# Patient Record
Sex: Male | Born: 1975 | Race: Black or African American | Hispanic: No | Marital: Married
Health system: Southern US, Community
[De-identification: ages and names within clinical notes are randomized; demographics above are authoritative.]

## PROBLEM LIST (undated history)

## (undated) DIAGNOSIS — K219 Gastro-esophageal reflux disease without esophagitis: Secondary | ICD-10-CM

## (undated) DIAGNOSIS — E119 Type 2 diabetes mellitus without complications: Secondary | ICD-10-CM

## (undated) DIAGNOSIS — N189 Chronic kidney disease, unspecified: Secondary | ICD-10-CM

## (undated) DIAGNOSIS — Z8659 Personal history of other mental and behavioral disorders: Secondary | ICD-10-CM

## (undated) DIAGNOSIS — Z8619 Personal history of other infectious and parasitic diseases: Secondary | ICD-10-CM

## (undated) DIAGNOSIS — E785 Hyperlipidemia, unspecified: Secondary | ICD-10-CM

## (undated) HISTORY — DX: Hyperlipidemia, unspecified: E78.5

## (undated) HISTORY — DX: Personal history of other infectious and parasitic diseases: Z86.19

## (undated) HISTORY — DX: Gastro-esophageal reflux disease without esophagitis: K21.9

## (undated) HISTORY — DX: Chronic kidney disease, unspecified: N18.9

## (undated) HISTORY — DX: Type 2 diabetes mellitus without complications: E11.9

## (undated) HISTORY — DX: Personal history of other mental and behavioral disorders: Z86.59

---

## 2012-06-02 ENCOUNTER — Emergency Department (HOSPITAL_COMMUNITY)
Admission: EM | Admit: 2012-06-02 | Discharge: 2012-06-02 | Disposition: A | Discharge status: Home / Self Care | Payer: Self-pay | Source: Home / Self Care

## 2012-06-02 ENCOUNTER — Encounter (HOSPITAL_COMMUNITY): Payer: Self-pay

## 2012-06-02 DIAGNOSIS — E119 Type 2 diabetes mellitus without complications: Secondary | ICD-10-CM

## 2012-06-02 LAB — CBC
MCH: 30.2 pg (ref 26.0–34.0)
MCHC: 37.6 g/dL — ABNORMAL HIGH (ref 30.0–36.0)
Platelets: 194 10*3/uL (ref 150–400)
RBC: 5.8 MIL/uL (ref 4.22–5.81)
RDW: 12 % (ref 11.5–15.5)

## 2012-06-02 LAB — COMPREHENSIVE METABOLIC PANEL
ALT: 23 U/L (ref 0–53)
AST: 20 U/L (ref 0–37)
Calcium: 9.9 mg/dL (ref 8.4–10.5)
Creatinine, Ser: 0.94 mg/dL (ref 0.50–1.35)
Sodium: 134 mEq/L — ABNORMAL LOW (ref 135–145)
Total Protein: 8.2 g/dL (ref 6.0–8.3)

## 2012-06-02 MED ORDER — FREESTYLE SYSTEM KIT: 1.0000 | PACK | Freq: Three times a day (TID) | Status: DC

## 2012-06-02 MED ORDER — METFORMIN HCL 850 MG PO TABS: 850.0000 mg | ORAL_TABLET | Freq: Two times a day (BID) | ORAL | Status: DC

## 2012-06-02 NOTE — ED Notes (Signed)
Patient Demographics  Rodney Pennington, is a 37 y.o. male  RUE:[id number]  JYN:[id number]  DOB - 1975-04-02  Chief Complaint  Patient presents with  . Excessive Sweating        Subjective:   Rodney Pennington history of type 2 diabetes mellitus is here to establish care, he also says that he has noticed some heat intolerance and sweating for the last 7-8 years, also says that he is been trying to have a child his wife but is unable to do so for the last 2-3 years. Denies any erectile problems, denies any fever chills, no chest pain cough phlegm palpitations, no history of STDs. No previous sexual contact except with his wife.  Objective:   History reviewed. No pertinent past medical history.    History reviewed. No pertinent past surgical history.   Filed Vitals:   06/02/12 1737  BP: 142/92  Pulse: 118 repeat bedside is 79   Temp: 98.6 F (37 C)  TempSrc: Oral  Resp: 17  SpO2: 99%     Exam  Awake Alert, Oriented X 3, No new F.N deficits, Normal affect Blairs.AT,PERRAL Supple Neck,No JVD, No cervical lymphadenopathy appriciated.  Symmetrical Chest wall movement, Good air movement bilaterally, CTAB RRR,No Gallops,Rubs or new Murmurs, No Parasternal Heave +ve B.Sounds, Abd Soft, Non tender, No organomegaly appriciated, No rebound - guarding or rigidity. No Cyanosis, Clubbing or edema, No new Rash or bruise  Genital and testicular exam is unremarkable no masses or lumps, no signs of varicocele    Data Review   CBC No results found for this basename: WBC, HGB, HCT, PLT, MCV, MCH, MCHC, RDW, NEUTRABS, LYMPHSABS, MONOABS, EOSABS, BASOSABS, BANDABS, BANDSABD,  in the last 168 hours  Chemistries   No results found for this basename: NA, K, CL, CO2, GLUCOSE, BUN, CREATININE, GFRCGP, CALCIUM, MG, AST, ALT, ALKPHOS, BILITOT,  in the last 168 hours ------------------------------------------------------------------------------------------------------------------ No results found for  this basename: HGBA1C,  in the last 72 hours ------------------------------------------------------------------------------------------------------------------ No results found for this basename: CHOL, HDL, LDLCALC, TRIG, CHOLHDL, LDLDIRECT,  in the last 72 hours ------------------------------------------------------------------------------------------------------------------ No results found for this basename: TSH, T4TOTAL, FREET3, T3FREE, THYROIDAB,  in the last 72 hours ------------------------------------------------------------------------------------------------------------------ No results found for this basename: VITAMINB12, FOLATE, FERRITIN, TIBC, IRON, RETICCTPCT,  in the last 72 hours  Coagulation profile  No results found for this basename: INR, PROTIME,  in the last 168 hours     Prior to Admission medications   Medication Sig Start Date End Date Taking? Authorizing Provider  glucose monitoring kit (FREESTYLE) monitoring kit 1 each by Does not apply route 4 (four) times daily - after meals and at bedtime. 1 month Diabetic Testing Supplies for QAC-QHS accuchecks. Switch to any brand that is cheaper covered. 06/02/12   Leroy Sea, MD  metFORMIN (GLUCOPHAGE) 850 MG tablet Take 1 tablet (850 mg total) by mouth 2 (two) times daily with a meal. 06/02/12   Leroy Sea, MD     Assessment & Plan   Diabetes mellitus type II. Patient is off of his Glucophage, will check A1c, we'll place him on Glucophage twice a day, provided him with glucometer and advised him to check q. a.c. at bedtime, log all results and bring in next visit.  Heat intolerance and excessive sweating for the last 7-8 years. Check TSH, CBC CMP baseline. Along with B12.  Problems with conceiving a child for the last 2 years. We'll do for him to urology for a semen analysis, physical exam is unremarkable,  he has good erections.   Follow-up Information   Schedule an appointment as soon as possible for a visit  in 1 month to follow up.      Follow up with GRAPEY,DAVID S, MD In 1 month. (for semen analysis and infertility workup)    Contact information:   [address]                         [place] [address] [phone]        Leroy Sea M.D on 06/02/2012 at 5:45 PM   Leroy Sea, MD 06/02/12 [phone]

## 2012-06-02 NOTE — ED Notes (Signed)
Patient here for excissive sweating States has elevated  Blood sugar

## 2012-06-03 LAB — HEMOGLOBIN A1C
Hgb A1c MFr Bld: 10.4 % — ABNORMAL HIGH (ref <5.7)
Mean Plasma Glucose: 252 mg/dL — ABNORMAL HIGH (ref <117)

## 2012-06-03 LAB — VITAMIN B12: Vitamin B-12: 387 pg/mL (ref 211–911)

## 2012-06-07 NOTE — ED Notes (Signed)
Referral faxed to urologist for semen analysis

## 2012-08-11 ENCOUNTER — Encounter: Payer: Self-pay | Admitting: Internal Medicine

## 2012-08-11 NOTE — Progress Notes (Signed)
Quick Note:  Please hava patient come back forDM management ______ 

## 2012-08-14 ENCOUNTER — Telehealth: Payer: Self-pay | Admitting: *Deleted

## 2012-08-14 NOTE — Telephone Encounter (Signed)
08/14/12 Message left for patient to call and schedule appoinment for  Follow up with her Diabetes. P.Devoiry Corriher,RN BSN

## 2015-03-02 HISTORY — PX: ERCP: SHX60

## 2019-05-28 ENCOUNTER — Ambulatory Visit: Payer: Self-pay | Attending: Internal Medicine

## 2019-05-28 DIAGNOSIS — Z23 Encounter for immunization: Secondary | ICD-10-CM

## 2019-05-28 NOTE — Progress Notes (Signed)
   Covid-19 Vaccination Clinic  Name:  Rodney Pennington    MRN: [id number] DOB: [birth year]  05/28/2019  Rodney Pennington was observed post Covid-19 immunization for 15 minutes without incident. He was provided with Vaccine Information Sheet and instruction to access the V-Safe system.   Rodney Pennington was instructed to call 911 with any severe reactions post vaccine: Marland Kitchen Difficulty breathing  . Swelling of face and throat  . A fast heartbeat  . A bad rash all over body  . Dizziness and weakness   Immunizations Administered    Name Date Dose VIS Date Route   JANSSEN COVID-19 VACCINE 05/28/2019  2:38 PM 0.5 mL 04/28/2019 Intramuscular   Manufacturer: [employer]   Lot: [id number]   NDC: [phone]

## 2019-06-28 ENCOUNTER — Other Ambulatory Visit: Payer: Self-pay

## 2019-06-29 ENCOUNTER — Ambulatory Visit (INDEPENDENT_AMBULATORY_CARE_PROVIDER_SITE_OTHER): Payer: 59 | Admitting: Family Medicine

## 2019-06-29 ENCOUNTER — Encounter: Payer: Self-pay | Admitting: Family Medicine

## 2019-06-29 DIAGNOSIS — E785 Hyperlipidemia, unspecified: Secondary | ICD-10-CM

## 2019-06-29 DIAGNOSIS — K219 Gastro-esophageal reflux disease without esophagitis: Secondary | ICD-10-CM

## 2019-06-29 DIAGNOSIS — E1169 Type 2 diabetes mellitus with other specified complication: Secondary | ICD-10-CM | POA: Diagnosis not present

## 2019-06-29 DIAGNOSIS — I10 Essential (primary) hypertension: Secondary | ICD-10-CM | POA: Insufficient documentation

## 2019-06-29 MED ORDER — ESOMEPRAZOLE MAGNESIUM 40 MG PO CPDR: 40.0000 mg | DELAYED_RELEASE_CAPSULE | Freq: Every day | ORAL | 3 refills | Status: DC

## 2019-06-29 MED ORDER — SITAGLIPTIN PHOSPHATE 100 MG PO TABS: 100.0000 mg | ORAL_TABLET | Freq: Every day | ORAL | 1 refills | Status: DC

## 2019-06-29 MED ORDER — TRULICITY 1.5 MG/0.5ML ~~LOC~~ SOAJ: 1.5000 mg | SUBCUTANEOUS | 0 refills | Status: DC

## 2019-06-29 NOTE — Patient Instructions (Addendum)
A few things to remember from today's visit:   Type 2 diabetes mellitus with other specified complication, without long-term current use of insulin (HCC) - Plan: Comprehensive metabolic panel, Hemoglobin A1c, Microalbumin / creatinine urine ratio, Fructosamine, Dulaglutide (TRULICITY) 1.5 MG/0.5ML SOPN  Hyperlipidemia associated with type 2 diabetes mellitus (HCC) - Plan: Comprehensive metabolic panel, Lipid panel  Hypertension, essential, benign - Plan: Comprehensive metabolic panel  Gastroesophageal reflux disease without esophagitis - Plan: esomeprazole (NEXIUM) 40 MG capsule Today while discontinuing Januvia and glipizide. Trulicity samples given to take once per week, if well-tolerated you can pick up prescription for 1.5 mg to continue once weekly. Metformin 500 mg with breakfast and supper. Further recommendation will be given according to A1c results, we may need to have insulin depending of numbers.  You should be on a cholesterol medication for cardiovascular protection.   Please be sure medication list is accurate. If a new problem present, please set up appointment sooner than planned today.

## 2019-06-29 NOTE — Progress Notes (Signed)
HPI:  Rodney Pennington is a 44 y.o. male, who is here today to establish care.  Former PCP: N/A Last preventive routine visit: Within the past year in [place].  Chronic medical problems: DM 2, CKD, hyperlipidemia,nephrolithiasis, "sweating disorder", chronic shoulder pain,and GERD among some. Cholelithiasis complicated by pancreatitis. He did not have cholecystectomy, s/p ERCP in 2017.  DM II: Dx'ed in 2013. He is on Glipizide 120 mg daily,Metformin 1500 mg daily,and Januvia 100 mg daily. He was on Metformin 850 mg, did not think is was being absorbed because the whole tab was in his stool. He thinks Metformin 500 mg works better.  He would like to establish with endocrinologist.  Metformin causes loose stools sometimes, proceeded by mild lower abdominal cramps. Last eye exam 5 years ago. Negative for feet numbness,tingling,or burning.  In 05/2012 HgA1C 10.4.  Negative for polydipsia,polyuria, or polyphagia.  HLD: He is on Non pharmacologic treatment.  "GI problems."  Having heartburn,burping, and "indigestion." Exacerbated by certain foods and by smoke when somebody smoking closed to him. No alleviating factors identified.  He has tried Omeprazole but with not significant improvement. He has not noted changes in bowel habits,N/V,melena,blood in stool,or abnormal wt loss.  Review of Systems  Constitutional: Negative for activity change, appetite change, fatigue and fever.  HENT: Negative for mouth sores, nosebleeds, sore throat and trouble swallowing.   Eyes: Negative for redness and visual disturbance.  Respiratory: Negative for cough, shortness of breath and wheezing.   Cardiovascular: Negative for chest pain, palpitations and leg swelling.  Genitourinary: Negative for decreased urine volume, dysuria and hematuria.  Musculoskeletal: Positive for arthralgias. Negative for gait problem and myalgias.  Skin: Negative for rash and wound.  Neurological:  Negative for dizziness, seizures, weakness, numbness and headaches.  Psychiatric/Behavioral: Negative for confusion.  Rest see pertinent positives and negatives per HPI.  Current Outpatient Medications on File Prior to Visit  Medication Sig Dispense Refill  . glucose monitoring kit (FREESTYLE) monitoring kit 1 each by Does not apply route 4 (four) times daily - after meals and at bedtime. 1 month Diabetic Testing Supplies for QAC-QHS accuchecks. Switch to any brand that is cheaper covered. 1 each 1  . metFORMIN (GLUCOPHAGE) 500 MG tablet Take 500 mg by mouth 2 (two) times daily with a meal. 1000 mg after lunch and 500 mg after dinner daily    . Multiple Vitamin (MULTIVITAMIN) tablet Take 1 tablet by mouth daily.     No current facility-administered medications on file prior to visit.   Past Medical History:  Diagnosis Date  . Chronic kidney disease   . Diabetes mellitus without complication ([place])   . GERD (gastroesophageal reflux disease)   . History of chicken pox   . Hx of eating disorder   . Hyperlipidemia    No Known Allergies  Family History  Problem Relation Age of Onset  . Arthritis Mother   . Heart attack Mother   . Hyperlipidemia Mother   . Kidney disease Mother   . Arthritis Father   . Arthritis Sister   . Hypertension Paternal Uncle   . Hearing loss Sister   . Hypertension Sister   . Kidney disease Sister   . Diabetes Brother     Social History   Socioeconomic History  . Marital status: Married    Spouse name: Not on file  . Number of children: Not on file  . Years of education: Not on file  . Highest education level: Not  on file  Occupational History  . Not on file  Tobacco Use  . Smoking status: Never Smoker  . Smokeless tobacco: Never Used  Substance and Sexual Activity  . Alcohol use: No  . Drug use: Never  . Sexual activity: Yes  Other Topics Concern  . Not on file  Social History Narrative  . Not on file   Social Determinants of Health    Financial Resource Strain:   . Difficulty of Paying Living Expenses:   Food Insecurity:   . Worried About Charity fundraiser in the Last Year:   . Arboriculturist in the Last Year:   Transportation Needs:   . Film/video editor (Medical):   Marland Kitchen Lack of Transportation (Non-Medical):   Physical Activity:   . Days of Exercise per Week:   . Minutes of Exercise per Session:   Stress:   . Feeling of Stress :   Social Connections:   . Frequency of Communication with Friends and Family:   . Frequency of Social Gatherings with Friends and Family:   . Attends Religious Services:   . Active Member of Clubs or Organizations:   . Attends Archivist Meetings:   Marland Kitchen Marital Status:     Vitals:   06/29/19 1410  BP: 122/80  Pulse: 83  Resp: 12  Temp: 98 F (36.7 C)  SpO2: 99%   Body mass index is 24.94 kg/m.  Physical Exam  Nursing note reviewed. Constitutional: He is oriented to person, place, and time. He appears well-developed and well-nourished. No distress.  HENT:  Head: Normocephalic and atraumatic.  Mouth/Throat: Oropharynx is clear and moist and mucous membranes are normal.  Eyes: Pupils are equal, round, and reactive to light. Conjunctivae are normal.  Cardiovascular: Normal rate and regular rhythm.  No murmur heard. Pulses:      Dorsalis pedis pulses are 2+ on the right side and 2+ on the left side.  Respiratory: Effort normal and breath sounds normal. No respiratory distress.  GI: Soft. He exhibits no mass. There is no hepatomegaly. There is no abdominal tenderness.  Musculoskeletal:        General: No edema.  Lymphadenopathy:    He has no cervical adenopathy.  Neurological: He is alert and oriented to person, place, and time. He has normal strength. No cranial nerve deficit. Gait normal.  Skin: Skin is warm. No rash noted. No erythema.  Psychiatric: He has a normal mood and affect. Cognition and memory are normal.  Well groomed, good eye contact.    Diabetic Foot Exam - Simple   Simple Foot Form Diabetic Foot exam was performed with the following findings: Yes 06/29/2019  4:08 PM  Visual Inspection No deformities, no ulcerations, no other skin breakdown bilaterally: Yes Sensation Testing Intact to touch and monofilament testing bilaterally: Yes Pulse Check Posterior Tibialis and Dorsalis pulse intact bilaterally: Yes Comments     ASSESSMENT AND PLAN:  Rodney Pennington was seen today for establish care.  Diagnoses and all orders for this visit:  Lab Results  Component Value Date   HGBA1C 10.7 (H) 06/29/2019   Lab Results  Component Value Date   CREATININE 0.76 06/29/2019   BUN 13 06/29/2019   NA 139 06/29/2019   K 4.5 06/29/2019   CL 101 06/29/2019   CO2 22 06/29/2019   Lab Results  Component Value Date   ALT 14 06/29/2019   AST 14 06/29/2019   ALKPHOS 110 06/02/2012   BILITOT 0.5 06/29/2019  Lab Results  Component Value Date   CHOL 238 (H) 06/29/2019   HDL 49 06/29/2019   LDLCALC 152 (H) 06/29/2019   TRIG 229 (H) 06/29/2019   CHOLHDL 4.9 06/29/2019   Lab Results  Component Value Date   MICROALBUR 5.4 06/29/2019    Type 2 diabetes mellitus with other specified complication, without long-term current use of insulin (HCC) Problem has not been well controlled  No changes in current management. Stop Glipizide and Januvia. Recommend trying Trulicity, 643 mg samples given. No changes in Metformin, tried to reassure in regard to metformin not being absorbed.  Regular exercise and healthy diet with avoidance of added sugar food intake is an important part of treatment and recommended. Annual eye exam, periodic dental and foot care recommended.  -     Dulaglutide (TRULICITY) 1.5 XU/2.7AR SOPN; Inject 1.5 mg into the skin once a week.  Hyperlipidemia associated with type 2 diabetes mellitus ([place]) He is not on statin med, we discussed CV benefits Further recommendations according to lipid panel results.   Gastroesophageal reflux disease without esophagitis GERD precautions. He would like to try Nexium, 40 mg recommended. Once symptoms are well controlled he can try to decrease dose to 20 mg.  -     esomeprazole (NEXIUM) 40 MG capsule; Take 1 capsule (40 mg total) by mouth daily.   Return in about 4 months (around 10/29/2019).    Blakely Gluth G. [place], MD  [facility]. [place] office.

## 2019-07-02 ENCOUNTER — Other Ambulatory Visit: Payer: Self-pay | Admitting: *Deleted

## 2019-07-02 MED ORDER — INSULIN GLARGINE 100 UNIT/ML ~~LOC~~ SOLN: 10.0000 [IU] | Freq: Every day | SUBCUTANEOUS | 11 refills | Status: DC

## 2019-07-02 MED ORDER — ATORVASTATIN CALCIUM 20 MG PO TABS: 20.0000 mg | ORAL_TABLET | Freq: Every day | ORAL | 3 refills | Status: DC

## 2019-07-04 LAB — COMPREHENSIVE METABOLIC PANEL
AG Ratio: 1.5 (calc) (ref 1.0–2.5)
ALT: 14 U/L (ref 9–46)
AST: 14 U/L (ref 10–40)
Albumin: 4.6 g/dL (ref 3.6–5.1)
Alkaline phosphatase (APISO): 102 U/L (ref 36–130)
BUN: 13 mg/dL (ref 7–25)
CO2: 22 mmol/L (ref 20–32)
Calcium: 9.7 mg/dL (ref 8.6–10.3)
Chloride: 101 mmol/L (ref 98–110)
Creat: 0.76 mg/dL (ref 0.60–1.35)
Globulin: 3 g/dL (calc) (ref 1.9–3.7)
Glucose, Bld: 231 mg/dL — ABNORMAL HIGH (ref 65–99)
Potassium: 4.5 mmol/L (ref 3.5–5.3)
Sodium: 139 mmol/L (ref 135–146)
Total Bilirubin: 0.5 mg/dL (ref 0.2–1.2)
Total Protein: 7.6 g/dL (ref 6.1–8.1)

## 2019-07-04 LAB — LIPID PANEL
Cholesterol: 238 mg/dL — ABNORMAL HIGH (ref <200)
HDL: 49 mg/dL (ref >=40)
LDL Cholesterol (Calc): 152 mg/dL (calc) — ABNORMAL HIGH
Non-HDL Cholesterol (Calc): 189 mg/dL (calc) — ABNORMAL HIGH (ref <130)
Total CHOL/HDL Ratio: 4.9 (calc) (ref <5.0)
Triglycerides: 229 mg/dL — ABNORMAL HIGH (ref <150)

## 2019-07-04 LAB — MICROALBUMIN / CREATININE URINE RATIO
Creatinine, Urine: 243 mg/dL (ref 20–320)
Microalb Creat Ratio: 22 mcg/mg creat (ref <30)
Microalb, Ur: 5.4 mg/dL

## 2019-07-04 LAB — HEMOGLOBIN A1C
Hgb A1c MFr Bld: 10.7 % of total Hgb — ABNORMAL HIGH (ref <5.7)
Mean Plasma Glucose: 260 (calc)
eAG (mmol/L): 14.4 (calc)

## 2019-07-04 LAB — FRUCTOSAMINE: Fructosamine: 429 umol/L — ABNORMAL HIGH (ref 205–285)

## 2019-07-06 ENCOUNTER — Other Ambulatory Visit: Payer: Self-pay | Admitting: Family Medicine

## 2019-07-06 ENCOUNTER — Other Ambulatory Visit: Payer: Self-pay | Admitting: *Deleted

## 2019-07-06 DIAGNOSIS — E1169 Type 2 diabetes mellitus with other specified complication: Secondary | ICD-10-CM

## 2019-07-06 MED ORDER — ACCU-CHEK SOFTCLIX LANCETS MISC: 12 refills | Status: AC

## 2019-07-06 MED ORDER — ACCU-CHEK GUIDE ME W/DEVICE KIT: PACK | 0 refills | Status: AC

## 2019-07-06 MED ORDER — INSULIN GLARGINE 100 UNITS/ML SOLOSTAR PEN: 10.0000 [IU] | PEN_INJECTOR | Freq: Every day | SUBCUTANEOUS | 3 refills | Status: DC

## 2019-07-06 MED ORDER — METFORMIN HCL 500 MG PO TABS: 500.0000 mg | ORAL_TABLET | Freq: Two times a day (BID) | ORAL | 0 refills | Status: DC

## 2019-07-06 MED ORDER — ACCU-CHEK GUIDE VI STRP: ORAL_STRIP | 12 refills | Status: AC

## 2019-07-06 MED ORDER — INSULIN GLARGINE 100 UNITS/ML SOLOSTAR PEN: 10.0000 [IU] | PEN_INJECTOR | Freq: Every day | SUBCUTANEOUS | 11 refills | Status: DC

## 2019-07-31 ENCOUNTER — Other Ambulatory Visit: Payer: Self-pay | Admitting: *Deleted

## 2019-07-31 MED ORDER — BLOOD GLUCOSE METER KIT: PACK | 0 refills | Status: DC

## 2019-07-31 MED ORDER — BLOOD GLUCOSE METER KIT: PACK | 0 refills | Status: AC

## 2019-08-03 ENCOUNTER — Ambulatory Visit (INDEPENDENT_AMBULATORY_CARE_PROVIDER_SITE_OTHER): Payer: 59 | Admitting: Internal Medicine

## 2019-08-03 ENCOUNTER — Other Ambulatory Visit: Payer: Self-pay

## 2019-08-03 ENCOUNTER — Encounter: Payer: Self-pay | Admitting: Internal Medicine

## 2019-08-03 VITALS — BP 124/82 | HR 99 | Ht 66.8 in | Wt 163.6 lb

## 2019-08-03 DIAGNOSIS — R61 Generalized hyperhidrosis: Secondary | ICD-10-CM | POA: Diagnosis not present

## 2019-08-03 DIAGNOSIS — E785 Hyperlipidemia, unspecified: Secondary | ICD-10-CM | POA: Diagnosis not present

## 2019-08-03 DIAGNOSIS — E1169 Type 2 diabetes mellitus with other specified complication: Secondary | ICD-10-CM | POA: Diagnosis not present

## 2019-08-03 LAB — GLUCOSE, POCT (MANUAL RESULT ENTRY): POC Glucose: 227 mg/dl — AB (ref 70–99)

## 2019-08-03 MED ORDER — METFORMIN HCL 500 MG PO TABS: 1500.0000 mg | ORAL_TABLET | Freq: Every day | ORAL | 3 refills | Status: DC

## 2019-08-03 MED ORDER — INSULIN GLARGINE 100 UNITS/ML SOLOSTAR PEN: 14.0000 [IU] | PEN_INJECTOR | Freq: Every day | SUBCUTANEOUS | 6 refills | Status: DC

## 2019-08-03 MED ORDER — INSULIN PEN NEEDLE 32G X 4 MM MISC: 1.0000 | Freq: Every day | 11 refills | Status: DC

## 2019-08-03 MED ORDER — TRULICITY 1.5 MG/0.5ML ~~LOC~~ SOAJ: 1.5000 mg | SUBCUTANEOUS | 3 refills | Status: DC

## 2019-08-03 MED ORDER — LANTUS SOLOSTAR 100 UNIT/ML ~~LOC~~ SOPN: 14.0000 [IU] | PEN_INJECTOR | Freq: Every day | SUBCUTANEOUS | 11 refills | Status: DC

## 2019-08-03 NOTE — Progress Notes (Signed)
Name: Rodney Pennington  MRN/ DOB: [id number], 06-03-1975   Age/ Sex: 44 y.o., male    PCP: Martin[place], Betty G, MD   Reason for Endocrinology Evaluation: Type 2 Diabetes Mellitus     Date of Initial Endocrinology Visit: 08/03/2019     PATIENT IDENTIFIER: Rodney Pennington is a 44 y.o. male with a past medical history of T2DM, CKD, Hx of pancreatitis due to gallstones (S/P ERCP) , renal stones , fatty liver  and dyslipidemia . The patient presented for initial endocrinology clinic visit on 08/03/2019 for consultative assistance with his diabetes management.    HPI: Mr. [place] was    Diagnosed with DM in 2013 Prior Medications tried/Intolerance: Was on Metformin 750 mg ER but he believes this wasn't absorbed so switched to 500 mg tabs and doing well. Januvia switched to Trulicity in 04/6832, [place] .  Currently checking blood sugars 1 x / day Hypoglycemia episodes : no Hemoglobin A1c has ranged from 8.0% , peaking at 10.7% %  in 2021 Patient required assistance for hypoglycemia: no  Patient has required hospitalization within the last 1 year from hyper or hypoglycemia: no  In terms of diet, the patient eats 2 meals a day , snacks occasionally , has been avoiding sugar-sweetened beverages  Started  regular exercise   Pt chronic GI issues in the form of cramps, pain and occasional regurgitation  Pt with excessive sweating for years, prior TFT check has been normal    HOME DIABETES REGIMEN: Lantus - has not been taking  Trulicity 1.5 mg weekly  Metformin 500 mg , 1 tablet with breakfast and 2 tablet with lunch   Statin: yes ACE-I/ARB: no    METER DOWNLOAD SUMMARY:  Fasting 150-180  Post prandial 196'Q   DIABETIC COMPLICATIONS: Microvascular complications:     Denies: retinopathy , neuropathy   Last eye exam: Completed 2021  Macrovascular complications:    Denies: CAD, PVD, CVA   PAST HISTORY: Past Medical History:  Past Medical History:  Diagnosis  Date  . Chronic kidney disease   . Diabetes mellitus without complication ([place])   . GERD (gastroesophageal reflux disease)   . History of chicken pox   . Hx of eating disorder   . Hyperlipidemia    Past Surgical History:  Past Surgical History:  Procedure Laterality Date  . ERCP  2017   calcular cholecystitis      Social History:  reports that he has never smoked. He has never used smokeless tobacco. He reports that he does not drink alcohol or use drugs. Family History:  Family History  Problem Relation Age of Onset  . Arthritis Mother   . Heart attack Mother   . Hyperlipidemia Mother   . Kidney disease Mother   . Arthritis Father   . Arthritis Sister   . Hypertension Paternal Uncle   . Hearing loss Sister   . Hypertension Sister   . Kidney disease Sister   . Diabetes Brother      HOME MEDICATIONS: Allergies as of 08/03/2019   No Known Allergies     Medication List       Accurate as of August 03, 2019  4:15 PM. If you have any questions, ask your nurse or doctor.        Accu-Chek Guide Me w/Device Kit Use to test blood sugar 3 times daily   Accu-Chek Guide test strip Generic drug: glucose blood Use to test blood sugar 3 times daily.   Accu-Chek Softclix Lancets  lancets Use to test blood sugar 3 times daily.   atorvastatin 20 MG tablet Commonly known as: LIPITOR Take 1 tablet (20 mg total) by mouth daily.   blood glucose meter kit and supplies Use to test blood sugar 3 times daily.   esomeprazole 40 MG capsule Commonly known as: NexIUM Take 1 capsule (40 mg total) by mouth daily.   Insulin Pen Needle 32G X 4 MM Misc 1 Device by Does not apply route daily. Started by: Dorita Sciara, MD   Lantus SoloStar 100 UNIT/ML Solostar Pen Generic drug: insulin glargine Inject 14 Units into the skin daily. What changed:   medication strength  how much to take Changed by: Dorita Sciara, MD   metFORMIN 500 MG tablet Commonly known as:  GLUCOPHAGE Take 3 tablets (1,500 mg total) by mouth daily. What changed: See the new instructions. Changed by: Dorita Sciara, MD   multivitamin tablet Take 1 tablet by mouth daily.   Trulicity 1.5 ZO/1.0RU Sopn Generic drug: Dulaglutide Inject 0.5 mLs (1.5 mg total) into the skin once a week.        ALLERGIES: No Known Allergies   REVIEW OF SYSTEMS: A comprehensive ROS was conducted with the patient and is negative except as per HPI    OBJECTIVE:   VITAL SIGNS: BP 124/82   Pulse 99   Ht 5' 6.8" (1.697 m)   Wt 163 lb 9.6 oz (74.2 kg)   SpO2 98%   BMI 25.78 kg/m    PHYSICAL EXAM:  General: Pt appears well and is in NAD  HEENT:  Eyes: External eye exam normal without stare, lid lag or exophthalmos.  EOM intact.   Neck: General: Supple without adenopathy or carotid bruits. Thyroid: Thyroid size normal.  No goiter or nodules appreciated. No thyroid bruit.  Lungs: Clear with good BS bilat with no rales, rhonchi, or wheezes  Heart: RRR with normal S1 and S2 and no gallops; no murmurs; no rub  Abdomen: Normoactive bowel sounds, soft, nontender, without masses or organomegaly palpable  Extremities:  Lower extremities - No pretibial edema. No lesions.  Skin: Normal texture and temperature to palpation.  Neuro: MS is good with appropriate affect, pt is alert and Ox3    DM foot exam: 08/03/2019 The skin of the feet is intact without sores or ulcerations. The pedal pulses are 2+ on right and 2+ on left. The sensation is intact to a screening 5.07, 10 gram monofilament bilaterally   DATA REVIEWED:  Results for ADAIR, LAUDERBACK (MRN [id number]) as of 08/06/2019 07:37  Ref. Range 08/03/2019 16:23  TSH Latest Ref Range: 0.40 - 4.50 mIU/L 0.40  T4,Free(Direct) Latest Ref Range: 0.8 - 1.8 ng/dL 1.2     Lab Results  Component Value Date   HGBA1C 10.7 (H) 06/29/2019   HGBA1C 10.4 (H) 06/02/2012   Lab Results  Component Value Date   MICROALBUR 5.4 06/29/2019    LDLCALC 152 (H) 06/29/2019   CREATININE 0.76 06/29/2019   Lab Results  Component Value Date   MICRALBCREAT 22 06/29/2019    Lab Results  Component Value Date   CHOL 238 (H) 06/29/2019   HDL 49 06/29/2019   LDLCALC 152 (H) 06/29/2019   TRIG 229 (H) 06/29/2019   CHOLHDL 4.9 06/29/2019        ASSESSMENT / PLAN / RECOMMENDATIONS:   1) Type 2 Diabetes Mellitus, controlled, Without complications - Most recent A1c of 10.7 %. Goal A1c < 7.0 %.    Plan: GENERAL: -  I have discussed with the patient the pathophysiology of diabetes. We went over the natural progression of the disease. We talked about both insulin resistance and insulin deficiency. We stressed the importance of lifestyle changes including diet and exercise. I explained the complications associated with diabetes including retinopathy, nephropathy, neuropathy as well as increased risk of cardiovascular disease. We went over the benefit seen with glycemic control.  - I explained to the patient that diabetic patients are at higher than normal risk for amputations.  - He has not been able to start on the lantus yet as he is missing pen needles - I will continue with trulicity for now but will have low threshold in discontinuation due to increased risk of pancreatitis, given prior hx of pancreatitis due to cholelithiasis  - Pt does have chronic GI symptoms but these have not worsened since being on Trulicity.   MEDICATIONS: - Lantus 14 units daily  - Continue Metformin 500 mg, three tablets daily  - Continue Trulicity 1.5 mg weekly   EDUCATION / INSTRUCTIONS:  BG monitoring instructions: Patient is instructed to check his blood sugars 2 times a day, fasting and bedtime .  Call [facility] if: BG persistently < 70 or > 300. . I reviewed the Rule of 15 for the treatment of hypoglycemia in detail with the patient. Literature supplied.   2) Diabetic complications:   Eye: Does not have known diabetic retinopathy.    Neuro/ Feet: Does not have known diabetic peripheral neuropathy.  Renal: Patient does not have known baseline CKD. He is not on an ACEI/ARB at present.   3) Dyslipidemia: Patient is on Atorvatstain . Discussed cardiovascular benefits of statins.    4) Hyperhidrosis:  - This has been ongoing since the early 2000's.  - Pt would like TFT check, TSH and FT4 are normal.      F/U in 3 months     Signed electronically by: Mack Guise, MD  [facility]  [facility] [address] [place], [place] [address] Phone: [phone] FAX: [phone]   CC: [place], Betty G, [place] [address] Phone: [phone]  Fax: [phone]    Return to Endocrinology clinic as below: Future Appointments  Date Time Provider [place]  10/29/2019  2:00 PM [place], Betty G, MD [facility]  11/16/2019  2:40 PM Demarius Archila, Melanie Crazier, MD [facility] None

## 2019-08-03 NOTE — Patient Instructions (Signed)
-   Lantus 14 units daily  - Continue Metformin 500 mg, three tablets daily  - Continue Trulicity 1.5 mg weekly       HOW TO TREAT LOW BLOOD SUGARS (Blood sugar LESS THAN 70 MG/DL)  Please follow the RULE OF 15 for the treatment of hypoglycemia treatment (when your (blood sugars are less than 70 mg/dL)    STEP 1: Take 15 grams of carbohydrates when your blood sugar is low, which includes:   3-4 GLUCOSE TABS  OR  3-4 OZ OF JUICE OR REGULAR SODA OR  ONE TUBE OF GLUCOSE GEL     STEP 2: RECHECK blood sugar in 15 MINUTES STEP 3: If your blood sugar is still low at the 15 minute recheck --> then, go back to STEP 1 and treat AGAIN with another 15 grams of carbohydrates.

## 2019-08-04 LAB — TSH: TSH: 0.4 mIU/L (ref 0.40–4.50)

## 2019-08-04 LAB — T4, FREE: Free T4: 1.2 ng/dL (ref 0.8–1.8)

## 2019-08-06 ENCOUNTER — Encounter: Payer: Self-pay | Admitting: Internal Medicine

## 2019-08-15 ENCOUNTER — Telehealth: Payer: Self-pay | Admitting: *Deleted

## 2019-08-15 NOTE — Telephone Encounter (Signed)
Called patient to verify date of birth due to having 3 different dates, two on some paperwork and one in our system. Patient confirmed that his date of birth is [birth year].

## 2019-09-03 ENCOUNTER — Other Ambulatory Visit: Payer: Self-pay | Admitting: Family Medicine

## 2019-09-03 DIAGNOSIS — E1169 Type 2 diabetes mellitus with other specified complication: Secondary | ICD-10-CM

## 2019-09-24 ENCOUNTER — Ambulatory Visit: Payer: 59 | Admitting: Internal Medicine

## 2019-10-29 ENCOUNTER — Other Ambulatory Visit: Payer: Self-pay

## 2019-10-29 ENCOUNTER — Ambulatory Visit (INDEPENDENT_AMBULATORY_CARE_PROVIDER_SITE_OTHER): Payer: 59 | Admitting: Family Medicine

## 2019-10-29 ENCOUNTER — Other Ambulatory Visit: Payer: Self-pay | Admitting: Family Medicine

## 2019-10-29 ENCOUNTER — Encounter: Payer: Self-pay | Admitting: Family Medicine

## 2019-10-29 VITALS — BP 130/88 | HR 113 | Temp 98.2°F | Resp 16 | Ht 66.8 in | Wt 165.2 lb

## 2019-10-29 DIAGNOSIS — K219 Gastro-esophageal reflux disease without esophagitis: Secondary | ICD-10-CM

## 2019-10-29 DIAGNOSIS — K529 Noninfective gastroenteritis and colitis, unspecified: Secondary | ICD-10-CM

## 2019-10-29 DIAGNOSIS — E1169 Type 2 diabetes mellitus with other specified complication: Secondary | ICD-10-CM | POA: Diagnosis not present

## 2019-10-29 DIAGNOSIS — M7541 Impingement syndrome of right shoulder: Secondary | ICD-10-CM | POA: Diagnosis not present

## 2019-10-29 DIAGNOSIS — E785 Hyperlipidemia, unspecified: Secondary | ICD-10-CM

## 2019-10-29 DIAGNOSIS — M542 Cervicalgia: Secondary | ICD-10-CM

## 2019-10-29 LAB — POCT GLYCOSYLATED HEMOGLOBIN (HGB A1C): Hemoglobin A1C: 6.8 % — AB (ref 4.0–5.6)

## 2019-10-29 MED ORDER — PANTOPRAZOLE SODIUM 40 MG PO TBEC: 40.0000 mg | DELAYED_RELEASE_TABLET | Freq: Every day | ORAL | 1 refills | Status: DC

## 2019-10-29 NOTE — Progress Notes (Signed)
HPI: Rodney Pennington is a 44 y.o. male, who is here today for 4 months follow up.   He was last seen on 06/29/19. Sine his last OV he has established with endocrinologist. He is on Trulicity 1.5 mg weekly, Metformin 500 mg tid,and Lantus 14 U. She polydipsia,polyuria, or polyphagia.  He would like A1C done today.  FG: 130-180's and post prandial: 190-200's.   Lab Results  Component Value Date   HGBA1C 10.7 (H) 06/29/2019   HLD: He is on Atorvastatin 20 mg daily. Tolerating medication well.  Lab Results  Component Value Date   CHOL 238 (H) 06/29/2019   HDL 49 06/29/2019   LDLCALC 152 (H) 06/29/2019   TRIG 229 (H) 06/29/2019   CHOLHDL 4.9 06/29/2019   C/O intermittent episodes of lower abdominal pain,"indigestion",and diarrhea. Sometimes he sees undigested food in stool. Abdominal pain improves with defecation. No blood in stool or melena but sometimes mucus. This happens 3 times per week. Problem is exacerbated by stress and spicy food. He does not think Metformin is making problem worse.  + Heartburn. Symptoms exacerbated by any type of food. He has not had EGD in the past. He would like GI evaluation.  Right shoulder pain, chronic but now radiating to right side of the neck. Takes Ibuprofen sometimes. No recent injuries. Exacerbated movements when driving. Pain is constant. 7-8/10 exacerbated with activity. Rest 4-5/10.  Review of Systems  Constitutional: Negative for activity change, appetite change and fever.  HENT: Negative for mouth sores, nosebleeds, sore throat and trouble swallowing.   Eyes: Negative for redness and visual disturbance.  Respiratory: Negative for cough, shortness of breath and wheezing.   Cardiovascular: Negative for chest pain, palpitations and leg swelling.  Genitourinary: Negative for decreased urine volume, dysuria and hematuria.  Musculoskeletal: Positive for neck pain. Negative for gait problem and myalgias.  Skin:  Negative for rash and wound.  Neurological: Negative for syncope, weakness and headaches.  Rest of ROS, see pertinent positives sand negatives in HPI  Current Outpatient Medications on File Prior to Visit  Medication Sig Dispense Refill  . Accu-Chek Softclix Lancets lancets Use to test blood sugar 3 times daily. 100 each 12  . atorvastatin (LIPITOR) 20 MG tablet Take 1 tablet (20 mg total) by mouth daily. 90 tablet 3  . blood glucose meter kit and supplies Use to test blood sugar 3 times daily. 1 each 0  . Blood Glucose Monitoring Suppl (ACCU-CHEK GUIDE ME) w/Device KIT Use to test blood sugar 3 times daily 1 kit 0  . Dulaglutide (TRULICITY) 1.5 TM/5.4YT SOPN Inject 0.5 mLs (1.5 mg total) into the skin once a week. 13 pen 3  . glucose blood (ACCU-CHEK GUIDE) test strip Use to test blood sugar 3 times daily. 100 each 12  . insulin glargine (LANTUS SOLOSTAR) 100 UNIT/ML Solostar Pen Inject 14 Units into the skin daily. 15 mL 11  . Insulin Pen Needle 32G X 4 MM MISC 1 Device by Does not apply route daily. 50 each 11  . metFORMIN (GLUCOPHAGE) 500 MG tablet Take 1 tablet (500 mg total) by mouth 3 (three) times daily. 270 tablet 3  . Multiple Vitamin (MULTIVITAMIN) tablet Take 1 tablet by mouth daily.     No current facility-administered medications on file prior to visit.   Past Medical History:  Diagnosis Date  . Chronic kidney disease   . Diabetes mellitus without complication ([place])   . GERD (gastroesophageal reflux disease)   . History of  chicken pox   . Hx of eating disorder   . Hyperlipidemia    No Known Allergies  Social History   Socioeconomic History  . Marital status: Married    Spouse name: Not on file  . Number of children: Not on file  . Years of education: Not on file  . Highest education level: Not on file  Occupational History  . Not on file  Tobacco Use  . Smoking status: Never Smoker  . Smokeless tobacco: Never Used  Vaping Use  . Vaping Use: Never used    Substance and Sexual Activity  . Alcohol use: No  . Drug use: Never  . Sexual activity: Yes  Other Topics Concern  . Not on file  Social History Narrative  . Not on file   Social Determinants of Health   Financial Resource Strain:   . Difficulty of Paying Living Expenses: Not on file  Food Insecurity:   . Worried About Charity fundraiser in the Last Year: Not on file  . Ran Out of Food in the Last Year: Not on file  Transportation Needs:   . Lack of Transportation (Medical): Not on file  . Lack of Transportation (Non-Medical): Not on file  Physical Activity:   . Days of Exercise per Week: Not on file  . Minutes of Exercise per Session: Not on file  Stress:   . Feeling of Stress : Not on file  Social Connections:   . Frequency of Communication with Friends and Family: Not on file  . Frequency of Social Gatherings with Friends and Family: Not on file  . Attends Religious Services: Not on file  . Active Member of Clubs or Organizations: Not on file  . Attends Archivist Meetings: Not on file  . Marital Status: Not on file   Vitals:   10/29/19 1411  BP: 130/88  Pulse: (!) 113  Resp: 16  Temp: 98.2 F (36.8 C)  SpO2: 99%   Body mass index is 26.03 kg/m.  Physical Exam Vitals and nursing note reviewed.  Constitutional:      General: He is not in acute distress.    Appearance: He is well-developed.  HENT:     Head: Normocephalic and atraumatic.     Mouth/Throat:     Mouth: Mucous membranes are moist.     Pharynx: Oropharynx is clear.  Eyes:     Conjunctiva/sclera: Conjunctivae normal.     Pupils: Pupils are equal, round, and reactive to light.  Cardiovascular:     Rate and Rhythm: Normal rate and regular rhythm.     Pulses:          Dorsalis pedis pulses are 2+ on the right side and 2+ on the left side.     Heart sounds: No murmur heard.      Comments: HR 88/Min Pulmonary:     Effort: Pulmonary effort is normal. No respiratory distress.      Breath sounds: Normal breath sounds.  Abdominal:     Palpations: Abdomen is soft. There is no hepatomegaly or mass.     Tenderness: There is no abdominal tenderness.  Musculoskeletal:     Right shoulder: Tenderness present. No deformity. Normal range of motion.     Comments: Right shoulder: No deformity, edema, or erythema appreciated.No muscle atrophy. Rodney Pennington' test pos, empty can supraspinatus test positive,lift-Off Subscapularis test elicits pain.   Lymphadenopathy:     Cervical: No cervical adenopathy.  Skin:    General: Skin is  warm.     Findings: No erythema or rash.  Neurological:     Mental Status: He is alert and oriented to person, place, and time.     Cranial Nerves: No cranial nerve deficit.     Gait: Gait normal.  Psychiatric:        Mood and Affect: Mood and affect normal.     Comments: Well groomed, good eye contact.   ASSESSMENT AND PLAN:  Mr. Owais Pruett Wilmeth was seen today for 4 months follow-up.  Orders Placed This Encounter  Procedures  . BASIC METABOLIC PANEL WITH GFR  . Lipid panel  . Ambulatory referral to Gastroenterology  . Ambulatory referral to Orthopedic Surgery  . POCT glycosylated hemoglobin (Hb A1C)   Lab Results  Component Value Date   HGBA1C 6.8 (A) 10/29/2019   Gastroesophageal reflux disease without esophagitis Nexium is not helping. Recommend changing to Protonix 40 mg daily. GERD precautions. GI referral placed.  -     pantoprazole (PROTONIX) 40 MG tablet; Take 1 tablet (40 mg total) by mouth daily.  Type 2 diabetes mellitus with other specified complication, without long-term current use of insulin (HCC) HgA1C at goal. No changes in current management. Regular exercise and healthy diet with avoidance of added sugar food intake is an important part of treatment and recommended. Appropriate foot and dental care discussed. He needs an eye exam. Continue following with endocrinologist.  Hyperlipidemia associated with type 2  diabetes mellitus ([place]) Continue Atorvastatin 20 mg daily. Statin dose will be adjusted according to FLP results.  Chronic diarrhea We discussed possible etiologies. ? IBS. He may need a colonoscopy. GI referral placed.  Impingement syndrome of right shoulder Educated about Dx and treatment options. Instead PT he would like ortho evaluation.  Neck pain on right side Local massage and OTC asper cream or icy hot may help.  Spent 42 minutes with pt. During this time Hx was obtained and documented, examination was performed, labs reviewed, and plan discussed.  Return in about 4 months (around 02/28/2020) for cpe.   Elanor Cale G. [place], MD  [facility]. [place] office.   A few things to remember from today's visit:   Type 2 diabetes mellitus with other specified complication, without long-term current use of insulin ([place])  Hyperlipidemia associated with type 2 diabetes mellitus (HCC)  Gastroesophageal reflux disease without esophagitis - Plan: pantoprazole (PROTONIX) 40 MG tablet, Ambulatory referral to Gastroenterology  Chronic diarrhea  No changes in diabetes medication. Take blood sugar log to next appt with endocrinologist.  Ortho referral for right shoulder pain.   Gastro referral placed. Nexium stopped. Protonix started.  Fasting labs in a couple days.  Check heart rate and blood pressure at home. Goal for blood pressure 130/80 or less and heart rate less than 100/min.   If you need refills please call your pharmacy. Do not use My Chart to request refills or for acute issues that need immediate attention.    Please be sure medication list is accurate. If a new problem present, please set up appointment sooner than planned today.

## 2019-10-29 NOTE — Patient Instructions (Addendum)
A few things to remember from today's visit:   Type 2 diabetes mellitus with other specified complication, without long-term current use of insulin (HCC)  Hyperlipidemia associated with type 2 diabetes mellitus (HCC)  Gastroesophageal reflux disease without esophagitis - Plan: pantoprazole (PROTONIX) 40 MG tablet, Ambulatory referral to Gastroenterology  Chronic diarrhea  No changes in diabetes medication. Take blood sugar log to next appt with endocrinologist.  Ortho referral for right shoulder pain.   Gastro referral placed. Nexium stopped. Protonix started.  Fasting labs in a couple days.  Check heart rate and blood pressure at home. Goal for blood pressure 130/80 or less and heart rate less than 100/min.   If you need refills please call your pharmacy. Do not use My Chart to request refills or for acute issues that need immediate attention.    Please be sure medication list is accurate. If a new problem present, please set up appointment sooner than planned today.

## 2019-11-01 ENCOUNTER — Encounter: Payer: Self-pay | Admitting: Family Medicine

## 2019-11-02 ENCOUNTER — Other Ambulatory Visit (INDEPENDENT_AMBULATORY_CARE_PROVIDER_SITE_OTHER): Payer: 59

## 2019-11-02 ENCOUNTER — Other Ambulatory Visit: Payer: Self-pay

## 2019-11-02 DIAGNOSIS — E785 Hyperlipidemia, unspecified: Secondary | ICD-10-CM | POA: Diagnosis not present

## 2019-11-02 DIAGNOSIS — E1169 Type 2 diabetes mellitus with other specified complication: Secondary | ICD-10-CM | POA: Diagnosis not present

## 2019-11-03 LAB — LIPID PANEL
Cholesterol: 169 mg/dL (ref <200)
HDL: 41 mg/dL (ref >=40)
Non-HDL Cholesterol (Calc): 128 mg/dL (calc) (ref <130)
Total CHOL/HDL Ratio: 4.1 (calc) (ref <5.0)
Triglycerides: 413 mg/dL — ABNORMAL HIGH (ref <150)

## 2019-11-03 LAB — BASIC METABOLIC PANEL WITH GFR
BUN: 13 mg/dL (ref 7–25)
CO2: 27 mmol/L (ref 20–32)
Calcium: 9.1 mg/dL (ref 8.6–10.3)
Chloride: 100 mmol/L (ref 98–110)
Creat: 0.9 mg/dL (ref 0.60–1.35)
GFR, Est African American: 120 mL/min/{1.73_m2} (ref >=60)
GFR, Est Non African American: 104 mL/min/{1.73_m2} (ref >=60)
Glucose, Bld: 151 mg/dL — ABNORMAL HIGH (ref 65–99)
Potassium: 4.4 mmol/L (ref 3.5–5.3)
Sodium: 137 mmol/L (ref 135–146)

## 2019-11-13 ENCOUNTER — Other Ambulatory Visit: Payer: Self-pay

## 2019-11-13 ENCOUNTER — Ambulatory Visit (INDEPENDENT_AMBULATORY_CARE_PROVIDER_SITE_OTHER): Payer: 59 | Admitting: Family Medicine

## 2019-11-13 ENCOUNTER — Ambulatory Visit (INDEPENDENT_AMBULATORY_CARE_PROVIDER_SITE_OTHER): Payer: 59

## 2019-11-13 ENCOUNTER — Encounter: Payer: Self-pay | Admitting: Family Medicine

## 2019-11-13 DIAGNOSIS — G8929 Other chronic pain: Secondary | ICD-10-CM

## 2019-11-13 DIAGNOSIS — M542 Cervicalgia: Secondary | ICD-10-CM

## 2019-11-13 DIAGNOSIS — M25511 Pain in right shoulder: Secondary | ICD-10-CM | POA: Diagnosis not present

## 2019-11-13 MED ORDER — BACLOFEN 10 MG PO TABS
5.0000 mg | ORAL_TABLET | Freq: Every evening | ORAL | 3 refills | Status: DC | PRN
Start: 2019-11-13 — End: 2019-11-16

## 2019-11-13 NOTE — Progress Notes (Signed)
Office Visit Note   Patient: Rodney Pennington           Date of Birth: 1975/03/10           MRN: [id number] Visit Date: 11/13/2019 Requested by: [place], Betty G, MD [address] [place],  [place] [address] PCP: [place], Betty G, MD  Subjective: Chief Complaint  Patient presents with  . Neck - Pain  . Right Shoulder - Pain    Pain around right scapula x approx 3 years. Has moved to base of neck, with weakness in the arm at times. Did have an incident 15 years ago: while trying to mount a horse, he went over the horse on his belly and fell over the other side, landing on head/    HPI: He is here with neck and right shoulder pain.  Symptoms started about 3 years ago, no injury around that time but he does note that about 15 years ago he fell off a horse and landed on his head, sustained a concussion.  He seemed to be okay after that and did not have any troubles with his neck or shoulder until 3 years ago.  He is not sure if the 2 events are connected.  Denies any numbness or tingling in his arm.  The pain is most intense in the scapular region.  It hurts when he leans his head to the left, it hurts when he does repetitive activities with his right arm, it hurts when he drives his car for a while with his hand on the steering wheel.  He does not take medication for his pain.               ROS:   All other systems were reviewed and are negative.  Objective: Vital Signs: There were no vitals taken for this visit.  Physical Exam:  General:  Alert and oriented, in no acute distress. Pulm:  Breathing unlabored. Psy:  Normal mood, congruent affect.  Neck: Full range of motion with pain at the extreme of sidebending to the left.  Spurling's test negative.  Upper extremity strength and reflexes are normal.  Very tender to the right of midline at C7-T1.  He has multiple trigger points in that area as well as in the rhomboid region.  Imaging: XR Cervical Spine 2 or 3 views  Result  Date: 11/13/2019 X-ray of the thoracic spine reveal well-preserved disc spaces, anatomic alignment. There is some early facet degenerative changes at the lower levels. No significant bone spurring.   Assessment & Plan: 1.  Chronic neck and right arm pain, could be myofascial but cannot rule out cervical disc protrusion. -We'll try physical therapy, baclofen as needed. I will have him take vitamin D3, vitamin K2, magnesium and coenzyme Q 10. -If symptoms persist we will proceed with MRI scan cervical spine.     Procedures: No procedures performed  No notes on file     PMFS History: Patient Active Problem List   Diagnosis Date Noted  . Excessive sweating 08/03/2019  . Dyslipidemia 08/03/2019  . Type 2 diabetes mellitus with other specified complication (HCC) 06/29/2019  . Hyperlipidemia associated with type 2 diabetes mellitus (HCC) 06/29/2019  . GERD (gastroesophageal reflux disease) 06/29/2019   Past Medical History:  Diagnosis Date  . Chronic kidney disease   . Diabetes mellitus without complication (HCC)   . GERD (gastroesophageal reflux disease)   . History of chicken pox   . Hx of eating disorder   .  Hyperlipidemia     Family History  Problem Relation Age of Onset  . Arthritis Mother   . Heart attack Mother   . Hyperlipidemia Mother   . Kidney disease Mother   . Arthritis Father   . Arthritis Sister   . Hypertension Paternal Uncle   . Hearing loss Sister   . Hypertension Sister   . Kidney disease Sister   . Diabetes Brother     Past Surgical History:  Procedure Laterality Date  . ERCP  2017   calcular cholecystitis   Social History   Occupational History  . Not on file  Tobacco Use  . Smoking status: Never Smoker  . Smokeless tobacco: Never Used  Vaping Use  . Vaping Use: Never used  Substance and Sexual Activity  . Alcohol use: No  . Drug use: Never  . Sexual activity: Yes

## 2019-11-13 NOTE — Patient Instructions (Signed)
    I would also recommend:  - Vitamin D3:  Take 5,000 IU daily  - Vitamin K2:  Take 100 mcg daily  - Magnesium:  Take 400 mg daily  - Coenzyme Q10:  Take 100 mg daily

## 2019-11-16 ENCOUNTER — Ambulatory Visit (INDEPENDENT_AMBULATORY_CARE_PROVIDER_SITE_OTHER): Payer: 59 | Admitting: Internal Medicine

## 2019-11-16 ENCOUNTER — Encounter: Payer: Self-pay | Admitting: Internal Medicine

## 2019-11-16 ENCOUNTER — Other Ambulatory Visit: Payer: Self-pay

## 2019-11-16 VITALS — BP 110/70 | HR 88 | Ht 66.8 in | Wt 165.2 lb

## 2019-11-16 DIAGNOSIS — E1169 Type 2 diabetes mellitus with other specified complication: Secondary | ICD-10-CM

## 2019-11-16 LAB — POCT GLUCOSE (DEVICE FOR HOME USE): POC Glucose: 99 mg/dl (ref 70–99)

## 2019-11-16 MED ORDER — DAPAGLIFLOZIN PROPANEDIOL 5 MG PO TABS: 5.0000 mg | ORAL_TABLET | Freq: Every day | ORAL | 0 refills | Status: DC

## 2019-11-16 MED ORDER — DAPAGLIFLOZIN PROPANEDIOL 10 MG PO TABS: 10.0000 mg | ORAL_TABLET | Freq: Every day | ORAL | 6 refills | Status: DC

## 2019-11-16 NOTE — Patient Instructions (Addendum)
-   Lantus 14 units daily  - Continue Metformin 500 mg, three tablets daily  - STOP Trulicity  - Start Farxiga 5 mg , 1 tablet with Breakfast, after 30 days, will increase to 10 mg tablets.       HOW TO TREAT LOW BLOOD SUGARS (Blood sugar LESS THAN 70 MG/DL)  Please follow the RULE OF 15 for the treatment of hypoglycemia treatment (when your (blood sugars are less than 70 mg/dL)    STEP 1: Take 15 grams of carbohydrates when your blood sugar is low, which includes:   3-4 GLUCOSE TABS  OR  3-4 OZ OF JUICE OR REGULAR SODA OR  ONE TUBE OF GLUCOSE GEL     STEP 2: RECHECK blood sugar in 15 MINUTES STEP 3: If your blood sugar is still low at the 15 minute recheck --> then, go back to STEP 1 and treat AGAIN with another 15 grams of carbohydrates.

## 2019-11-16 NOTE — Progress Notes (Signed)
Name: Edvin Mahgoub Casanas  Age/ Sex: 44 y.o., male   MRN/ DOB: [id number], 09/06/1975     PCP: [place], Betty G, MD   Reason for Endocrinology Evaluation: Type 2 Diabetes Mellitus  Initial Endocrine Consultative Visit: 08/03/2019    PATIENT IDENTIFIER: [name] is a 44 y.o. male with a past medical history of  T2DM, CKD, Hx of pancreatitis due to gallstones (S/P ERCP) , renal stones , fatty liver  and dyslipidemia .Marland Kitchen The patient has followed with Endocrinology clinic since 08/03/2019 for consultative assistance with management of his diabetes.  DIABETIC HISTORY:  Mr. Paragas was diagnosed with DM in 2013. He was on Metformin 750 mg ER but he believes this wasn't absorbed so switched to 500 mg tabs and doing well. Januvia switched to Trulicity in 05/8268,BE was also on  Dimacron .   His hemoglobin A1c has ranged from 8.0% , peaking at 10.7% %  in 2021    Pt chronic GI issues in the form of cramps, pain and occasional regurgitation     On his initial visit to our clinic his A1c was 10.7% , he was on metformin, trulicity and Metformin , and no changes were made as he was recently started on this regimen.   SUBJECTIVE:   During the last visit (08/13/2019): A1c 10.7% we continued metformin, trulicity and Metformin   Today (11/16/2019): Mr. Thielman is here for a follow up on diabetes management.  He checks his blood sugars 2 times daily. The patient has not had hypoglycemic episodes since the last clinic visit.   Has heart burn, regurgitation and epigastric pain   HOME DIABETES REGIMEN:  - Lantus 14 units daily- takes 15 units 3 times day   - Metformin 500 mg, 1 tablet with Breakfast and 2 tablet with supper  - Trulicity 1.5 mg weekly       Statin: Yes ACE-I/ARB: no    METER DOWNLOAD SUMMARY: Did not bring     DIABETIC COMPLICATIONS: Microvascular complications:    Denies: neuropathy, retinopathy and CKD  Last Eye Exam: Completed 2021  Macrovascular  complications:    Denies: CAD, CVA, PVD   HISTORY:  Past Medical History:  Past Medical History:  Diagnosis Date   Chronic kidney disease    Diabetes mellitus without complication (HCC)    GERD (gastroesophageal reflux disease)    History of chicken pox    Hx of eating disorder    Hyperlipidemia     Past Surgical History:  Past Surgical History:  Procedure Laterality Date   ERCP  2017   calcular cholecystitis     Social History:  reports that he has never smoked. He has never used smokeless tobacco. He reports that he does not drink alcohol and does not use drugs. Family History:  Family History  Problem Relation Age of Onset   Arthritis Mother    Heart attack Mother    Hyperlipidemia Mother    Kidney disease Mother    Arthritis Father    Arthritis Sister    Hypertension Paternal Uncle    Hearing loss Sister    Hypertension Sister    Kidney disease Sister    Diabetes Brother       HOME MEDICATIONS: Allergies as of 11/16/2019   No Known Allergies     Medication List       Accurate as of November 16, 2019 12:56 PM. If you have any questions, ask your nurse or doctor.  Accu-Chek Guide Me w/Device Kit Use to test blood sugar 3 times daily   Accu-Chek Guide test strip Generic drug: glucose blood Use to test blood sugar 3 times daily.   Accu-Chek Softclix Lancets lancets Use to test blood sugar 3 times daily.   atorvastatin 20 MG tablet Commonly known as: LIPITOR Take 1 tablet (20 mg total) by mouth daily.   baclofen 10 MG tablet Commonly known as: LIORESAL Take 0.5-1 tablets (5-10 mg total) by mouth at bedtime as needed for muscle spasms.   blood glucose meter kit and supplies Use to test blood sugar 3 times daily.   Insulin Pen Needle 32G X 4 MM Misc 1 Device by Does not apply route daily.   Lantus SoloStar 100 UNIT/ML Solostar Pen Generic drug: insulin glargine Inject 14 Units into the skin daily.   metFORMIN  500 MG tablet Commonly known as: GLUCOPHAGE Take 1 tablet (500 mg total) by mouth 3 (three) times daily.   multivitamin tablet Take 1 tablet by mouth daily.   pantoprazole 40 MG tablet Commonly known as: PROTONIX Take 1 tablet (40 mg total) by mouth daily.   Trulicity 1.5 XN/2.3FT Sopn Generic drug: Dulaglutide Inject 0.5 mLs (1.5 mg total) into the skin once a week.        OBJECTIVE:   Vital Signs: BP 110/70 (BP Location: Left Arm, Patient Position: Sitting, Cuff Size: Normal)    Pulse 88    Ht 5' 6.8" (1.697 m)    Wt 165 lb 3.2 oz (74.9 kg)    SpO2 99%    BMI 26.03 kg/m   Wt Readings from Last 3 Encounters:  10/29/19 165 lb 3.2 oz (74.9 kg)  08/03/19 163 lb 9.6 oz (74.2 kg)  06/29/19 164 lb (74.4 kg)     Exam: General: Pt appears well and is in NAD  Lungs: Clear with good BS bilat with no rales, rhonchi, or wheezes  Heart: RRR with normal S1 and S2 and no gallops; no murmurs; no rub  Abdomen: Normoactive bowel sounds, soft, nontender, without masses or organomegaly palpable  Extremities: No pretibial edema.   Neuro: MS is good with appropriate affect, pt is alert and Ox3    DM foot exam: 08/03/2019 The skin of the feet is intact without sores or ulcerations. The pedal pulses are 2+ on right and 2+ on left. The sensation is intact to a screening 5.07, 10 gram monofilament bilaterally           DATA REVIEWED:  Lab Results  Component Value Date   HGBA1C 6.8 (A) 10/29/2019   HGBA1C 10.7 (H) 06/29/2019   HGBA1C 10.4 (H) 06/02/2012   Lab Results  Component Value Date   MICROALBUR 5.4 06/29/2019   [place]  11/02/2019     Comment:     . LDL cholesterol not calculated. Triglyceride levels greater than 400 mg/dL invalidate calculated LDL results. . Reference range: <100 . Desirable range <100 mg/dL for primary prevention;   <70 mg/dL for patients with CHD or diabetic patients  with > or = 2 CHD risk factors. Marland Kitchen LDL-C is now calculated using the  Martin-Hopkins  calculation, which is a validated novel method providing  better accuracy than the Friedewald equation in the  estimation of LDL-C.  Cresenciano Genre et al. Annamaria Helling. 7322;025(42): 2061-2068  (http://education.[facility].com/faq/FAQ164)    CREATININE 0.90 11/02/2019   Lab Results  Component Value Date   MICRALBCREAT 22 06/29/2019     Lab Results  Component Value Date   CHOL  169 11/02/2019   HDL 41 11/02/2019   LDLCALC  11/02/2019     Comment:     . LDL cholesterol not calculated. Triglyceride levels greater than 400 mg/dL invalidate calculated LDL results. . Reference range: <100 . Desirable range <100 mg/dL for primary prevention;   <70 mg/dL for patients with CHD or diabetic patients  with > or = 2 CHD risk factors. Marland Kitchen LDL-C is now calculated using the Martin-Hopkins  calculation, which is a validated novel method providing  better accuracy than the Friedewald equation in the  estimation of LDL-C.  Cresenciano Genre et al. Annamaria Helling. 6283;662(94): 2061-2068  (http://education.[facility].com/faq/FAQ164)    TRIG 413 (H) 11/02/2019   CHOLHDL 4.1 11/02/2019         ASSESSMENT / PLAN / RECOMMENDATIONS:    1) Type 2 Diabetes Mellitus, Optimally controlled, Without complications - Most recent A1c of 6.8 %. Goal A1c < 7.0 %.    - I have praised the pt on optimal glycemia control without hypoglycemia - Given his GI issues and despite his assurance that these symptoms have been there prior to starting GLP-1 agonists , I have advised him to stop it for now until further GI evaluation.  - Despite his reluctance, as he likes the trulicity, he agreed to stop it at this time, we discussed replacing it with SGLt-2 inhibitors, we discussed the risk of genital infections, as well as cardiovascular benefits.  - He has been taking lantus everyother day, we discussed the pharmacokinetics of long acting insulin and the importance of taking it daily    MEDICA Lantus 14 units  daily  - Continue Metformin 500 mg, three tablets daily  - STOP Trulicity  - Start Farxiga 5 mg , 1 tablet with Breakfast, after 30 days, will increase to 10 mg tablets. TIONS:   EDUCATION / INSTRUCTIONS:  BG monitoring instructions: Patient is instructed to check his blood sugars 1 times a day, fasting .  Call [facility] if: BG persistently < 70  I reviewed the Rule of 15 for the treatment of hypoglycemia in detail with the patient. Literature supplied.    2) Diabetic complications:   Eye: Does not have known diabetic retinopathy.   Neuro/ Feet: Does not have known diabetic peripheral neuropathy .   Renal: Patient does not have known baseline CKD. He   is not on an ACEI/ARB at present.     F/U in 4 months    Signed electronically by: Mack Guise, MD  [facility] Endocrinology  [address] [place],  [address] Phone: [phone] FAX: [phone]   CC: [place], Betty G, [address] [place] [address] Phone: [phone]  Fax: [phone]  Return to Endocrinology clinic as below: Future Appointments  Date Time Provider [place]  11/16/2019  2:40 PM Beryl Hornberger, Melanie Crazier, MD [facility] None  11/26/2019  3:15 PM Girtha Rm, PT [facility] None  02/25/2020  7:30 AM [place], Betty G, MD [facility]

## 2019-11-20 ENCOUNTER — Telehealth: Payer: Self-pay | Admitting: Family Medicine

## 2019-11-20 NOTE — Telephone Encounter (Signed)
error 

## 2019-11-26 ENCOUNTER — Ambulatory Visit: Payer: 59 | Admitting: Rehabilitative and Restorative Service Providers"

## 2019-11-27 ENCOUNTER — Encounter: Payer: Self-pay | Admitting: Family Medicine

## 2019-11-27 ENCOUNTER — Other Ambulatory Visit: Payer: Self-pay

## 2019-11-27 ENCOUNTER — Ambulatory Visit (INDEPENDENT_AMBULATORY_CARE_PROVIDER_SITE_OTHER): Payer: 59 | Admitting: Family Medicine

## 2019-11-27 VITALS — BP 120/84 | HR 96 | Resp 12 | Ht 66.8 in | Wt 165.5 lb

## 2019-11-27 DIAGNOSIS — E781 Pure hyperglyceridemia: Secondary | ICD-10-CM | POA: Diagnosis not present

## 2019-11-27 DIAGNOSIS — R1013 Epigastric pain: Secondary | ICD-10-CM

## 2019-11-27 DIAGNOSIS — K219 Gastro-esophageal reflux disease without esophagitis: Secondary | ICD-10-CM | POA: Diagnosis not present

## 2019-11-27 DIAGNOSIS — Z1159 Encounter for screening for other viral diseases: Secondary | ICD-10-CM

## 2019-11-27 DIAGNOSIS — R000 Tachycardia, unspecified: Secondary | ICD-10-CM | POA: Diagnosis not present

## 2019-11-27 MED ORDER — DILTIAZEM HCL ER 60 MG PO CP12: 60.0000 mg | ORAL_CAPSULE | Freq: Two times a day (BID) | ORAL | 2 refills | Status: DC

## 2019-11-27 NOTE — Progress Notes (Signed)
HPI: [name] is a 44 y.o. male, who is here today for follow. He was last seen on 10/29/19. Sine his last visit he has followed with endocrinologist.  Last visit he was c/o heartburn and epigastric pain. Protonix 40 mg started.  Medication has helped, as far as he takes it daily he has no symptoms. Eigastric pain is now seldom. Exacerbated by eating spicing food. Concerned about pancreatic problems. Reporting Hx of pancreatitis, caused by gallbladder stones.  Negative for fever,chills,natua,or vomiting.  Appt with GI is still pending, possibly in 12/2019, for hx of diarrhea and lower abdominal pain. These problems are unchanged.  Last visit also noted sinus tach. He has been monitoring HR at home and usually in the low 100's, occasionally high 90's. 15 min after exercise he has HR of 125 and 118. Denies associated CP,SOB,palpitation,diaphoreis,or dizziness.  He has not identified exacerbating or alleviating factors. Lab Results  Component Value Date   TSH 0.40 08/03/2019   HLD: He is on Simvastatin 20 mg daily. TG elevated, I recommended adding Lovaza 1 g bid. He has not started medication. Wonders about accuracy of labs and would like to repeat TG.  He is trying to follow a healthful diet and started exercising regularly 2 weeks ago.  Lab Results  Component Value Date   CHOL 169 11/02/2019   HDL 41 11/02/2019   LDLCALC  11/02/2019     Comment:     . LDL cholesterol not calculated. Triglyceride levels greater than 400 mg/dL invalidate calculated LDL results. . Reference range: <100 . Desirable range <100 mg/dL for primary prevention;   <70 mg/dL for patients with CHD or diabetic patients  with > or = 2 CHD risk factors. Marland Kitchen LDL-C is now calculated using the Martin-Hopkins  calculation, which is a validated novel method providing  better accuracy than the Friedewald equation in the  estimation of LDL-C.  Cresenciano Genre et al. Annamaria Helling. 2778;242(35):  2061-2068  (http://education.[facility].com/faq/FAQ164)    TRIG 413 (H) 11/02/2019   CHOLHDL 4.1 11/02/2019    Review of Systems  Constitutional: Negative for activity change, appetite change and fatigue.  HENT: Negative for nosebleeds, sore throat and trouble swallowing.   Respiratory: Negative for cough and wheezing.   Cardiovascular: Negative for leg swelling.  Genitourinary: Negative for decreased urine volume and hematuria.  Musculoskeletal: Negative for gait problem and myalgias.  Neurological: Negative for syncope, weakness and headaches.  Rest see pertinent positives and negatives per HPI.  Current Outpatient Medications on File Prior to Visit  Medication Sig Dispense Refill  . Accu-Chek Softclix Lancets lancets Use to test blood sugar 3 times daily. 100 each 12  . blood glucose meter kit and supplies Use to test blood sugar 3 times daily. 1 each 0  . Blood Glucose Monitoring Suppl (ACCU-CHEK GUIDE ME) w/Device KIT Use to test blood sugar 3 times daily 1 kit 0  . dapagliflozin propanediol (FARXIGA) 10 MG TABS tablet Take 1 tablet (10 mg total) by mouth daily before breakfast. 30 tablet 6  . dapagliflozin propanediol (FARXIGA) 5 MG TABS tablet Take 1 tablet (5 mg total) by mouth daily before breakfast. 30 tablet 0  . glucose blood (ACCU-CHEK GUIDE) test strip Use to test blood sugar 3 times daily. 100 each 12  . insulin glargine (LANTUS SOLOSTAR) 100 UNIT/ML Solostar Pen Inject 14 Units into the skin daily. (Patient taking differently: Inject 10 Units into the skin daily. ) 15 mL 11  . Insulin Pen Needle 32G X  4 MM MISC 1 Device by Does not apply route daily. 50 each 11  . metFORMIN (GLUCOPHAGE) 500 MG tablet Take 1 tablet (500 mg total) by mouth 3 (three) times daily. 270 tablet 3  . Multiple Vitamin (MULTIVITAMIN) tablet Take 1 tablet by mouth daily.    . pantoprazole (PROTONIX) 40 MG tablet Take 1 tablet (40 mg total) by mouth daily. 90 tablet 1  . simvastatin (ZOCOR) 20  MG tablet Take 20 mg by mouth daily at 6 PM.     No current facility-administered medications on file prior to visit.     Past Medical History:  Diagnosis Date  . Chronic kidney disease   . Diabetes mellitus without complication ([place])   . GERD (gastroesophageal reflux disease)   . History of chicken pox   . Hx of eating disorder   . Hyperlipidemia    No Known Allergies  Social History   Socioeconomic History  . Marital status: Married    Spouse name: Not on file  . Number of children: Not on file  . Years of education: Not on file  . Highest education level: Not on file  Occupational History  . Not on file  Tobacco Use  . Smoking status: Never Smoker  . Smokeless tobacco: Never Used  Vaping Use  . Vaping Use: Never used  Substance and Sexual Activity  . Alcohol use: No  . Drug use: Never  . Sexual activity: Yes  Other Topics Concern  . Not on file  Social History Narrative  . Not on file   Social Determinants of Health   Financial Resource Strain:   . Difficulty of Paying Living Expenses: Not on file  Food Insecurity:   . Worried About Charity fundraiser in the Last Year: Not on file  . Ran Out of Food in the Last Year: Not on file  Transportation Needs:   . Lack of Transportation (Medical): Not on file  . Lack of Transportation (Non-Medical): Not on file  Physical Activity:   . Days of Exercise per Week: Not on file  . Minutes of Exercise per Session: Not on file  Stress:   . Feeling of Stress : Not on file  Social Connections:   . Frequency of Communication with Friends and Family: Not on file  . Frequency of Social Gatherings with Friends and Family: Not on file  . Attends Religious Services: Not on file  . Active Member of Clubs or Organizations: Not on file  . Attends Archivist Meetings: Not on file  . Marital Status: Not on file   Vitals:   11/27/19 1419  BP: 120/84  Pulse: 96  Resp: 12  SpO2: 98%   Wt Readings from Last 3  Encounters:  11/27/19 165 lb 8 oz (75.1 kg)  11/16/19 165 lb 3.2 oz (74.9 kg)  10/29/19 165 lb 3.2 oz (74.9 kg)    Body mass index is 26.08 kg/m.  Physical Exam Nursing note reviewed.  Constitutional:      General: He is not in acute distress.    Appearance: He is well-developed.  HENT:     Head: Normocephalic and atraumatic.  Eyes:     Conjunctiva/sclera: Conjunctivae normal.     Pupils: Pupils are equal, round, and reactive to light.  Cardiovascular:     Rate and Rhythm: Normal rate and regular rhythm.     Pulses:          Dorsalis pedis pulses are 2+ on the right  side and 2+ on the left side.     Heart sounds: No murmur heard.   Pulmonary:     Effort: Pulmonary effort is normal. No respiratory distress.     Breath sounds: Normal breath sounds.  Abdominal:     Palpations: Abdomen is soft. There is no hepatomegaly or mass.     Tenderness: There is no abdominal tenderness.  Lymphadenopathy:     Cervical: No cervical adenopathy.  Skin:    General: Skin is warm.     Findings: No erythema or rash.  Neurological:     Mental Status: He is alert and oriented to person, place, and time.     Cranial Nerves: No cranial nerve deficit.     Gait: Gait normal.  Psychiatric:     Comments: Well groomed, good eye contact.    ASSESSMENT AND PLAN:  [name] was seen today for follow-up.  Diagnoses and all orders for this visit: Orders Placed This Encounter  Procedures  . Triglycerides  . Lipase  . Hepatitis C antibody  . EKG 12-Lead   Lab Results  Component Value Date   LIPASE 34 11/27/2019   Sinus tachycardia Mild and asymptomatic. EKG today: SR, normal axis, ? LAE, IVCD. No other EKG for comparison. We discussed treatment options: BB vs CCB's, because SBP on lower normal range, recommend trying Diltiazem. Side effects discussed. Continue monitoring BP and HR regularly. Instructed about warning signs.  -     diltiazem (CARDIZEM SR) 60 MG 12 hr capsule; Take 1  capsule (60 mg total) by mouth 2 (two) times daily.  Gastroesophageal reflux disease without esophagitis Problem is better controlled. Continue Protonix 40 mg daily and GERD precautions.  Epigastric pain Improved. He is not longer on Trulicity. Instructed about warning signs.  Hypertriglyceridemia Continue Simvastatin 20 mg for now. We discussed possible interaction with Diltiazem but since it is not a high dose, no changes for now.  Encounter for HCV screening test for low risk patient -     Hepatitis C antibody   Return in about 8 weeks (around 01/22/2020).   Rodney Peaster G. [place], MD  [facility]. [place] office.   A few things to remember from today's visit:   Sinus tachycardia - Plan: EKG 12-Lead  Gastroesophageal reflux disease without esophagitis  Epigastric pain - Plan: Lipase  Hypertriglyceridemia - Plan: Triglycerides, Lipase  If you need refills please call your pharmacy. Do not use My Chart to request refills or for acute issues that need immediate attention.   Today Diltiazem 60 mg to take 2 times daily started to regulate your heart rate. Continue monitoring blood pressure and heart rate.  This new med can interact with certain statin meds like simvastatin. Because you are taking a low dose, continue for now but after lab results I may recommend changing to a different cholesterol medication.  Please be sure medication list is accurate. If a new problem present, please set up appointment sooner than planned today.

## 2019-11-27 NOTE — Patient Instructions (Addendum)
A few things to remember from today's visit:   Sinus tachycardia - Plan: EKG 12-Lead  Gastroesophageal reflux disease without esophagitis  Epigastric pain - Plan: Lipase  Hypertriglyceridemia - Plan: Triglycerides, Lipase  If you need refills please call your pharmacy. Do not use My Chart to request refills or for acute issues that need immediate attention.   Today Diltiazem 60 mg to take 2 times daily started to regulate your heart rate. Continue monitoring blood pressure and heart rate.  This new med can interact with certain statin meds like simvastatin. Because you are taking a low dose, continue for now but after lab results I may recommend changing to a different cholesterol medication.  Please be sure medication list is accurate. If a new problem present, please set up appointment sooner than planned today.

## 2019-11-28 LAB — LIPASE: Lipase: 34 U/L (ref 7–60)

## 2019-11-28 LAB — HEPATITIS C ANTIBODY
Hepatitis C Ab: NONREACTIVE
SIGNAL TO CUT-OFF: 0.07 (ref <1.00)

## 2019-11-28 LAB — TRIGLYCERIDES: Triglycerides: 205 mg/dL — ABNORMAL HIGH

## 2019-12-10 ENCOUNTER — Ambulatory Visit: Payer: 59 | Admitting: Physical Therapy

## 2019-12-10 ENCOUNTER — Other Ambulatory Visit: Payer: Self-pay

## 2019-12-10 DIAGNOSIS — R29898 Other symptoms and signs involving the musculoskeletal system: Secondary | ICD-10-CM | POA: Diagnosis not present

## 2019-12-10 DIAGNOSIS — G8929 Other chronic pain: Secondary | ICD-10-CM

## 2019-12-10 DIAGNOSIS — M25511 Pain in right shoulder: Secondary | ICD-10-CM | POA: Diagnosis not present

## 2019-12-10 DIAGNOSIS — R293 Abnormal posture: Secondary | ICD-10-CM | POA: Diagnosis not present

## 2019-12-10 DIAGNOSIS — M6281 Muscle weakness (generalized): Secondary | ICD-10-CM

## 2019-12-10 NOTE — Therapy (Signed)
[facility] Physical Therapy [address] [place], [place], [address] Phone: [phone]   Fax:  [phone]  Physical Therapy Evaluation  Patient Details  Name: Rodney Pennington MRN: [id number] Date of Birth: 1975-11-21 Referring Provider (PT): Lavada Mesi, MD   Encounter Date: 12/10/2019   PT End of Session - 12/10/19 1511    Visit Number 1    Number of Visits 12    Date for PT Re-Evaluation 01/21/20    PT Start Time 1433    PT Stop Time 1509    PT Time Calculation (min) 36 min    Activity Tolerance Patient tolerated treatment well    Behavior During Therapy [facility] for tasks assessed/performed           Past Medical History:  Diagnosis Date  . Chronic kidney disease   . Diabetes mellitus without complication (HCC)   . GERD (gastroesophageal reflux disease)   . History of chicken pox   . Hx of eating disorder   . Hyperlipidemia     Past Surgical History:  Procedure Laterality Date  . ERCP  2017   calcular cholecystitis    There were no vitals filed for this visit.    Subjective Assessment - 12/10/19 1437    Subjective Pt is a 44 y/o male who presents to OPPT for chronic neck and Rt shoulder pain.  Pt reports pain began ~ 5 years ago, but pain has increased over the past year.  Pt reports pain initially muscular pain around rhomboids and scapular muscles and has slowly worked up the shoulder.    Pertinent History DM, GERD    Limitations Lifting;House hold activities    Diagnostic tests xrays: negative    Patient Stated Goals improve pain, strength    Currently in Pain? Yes    Pain Score 0-No pain   up to 8/10   Pain Location Shoulder    Pain Orientation Right   into neck   Pain Descriptors / Indicators Aching    Pain Type Chronic pain    Pain Radiating Towards neck, medial borderof scapula    Pain Onset More than a month ago    Pain Frequency Intermittent    Aggravating Factors  driving for a long time, lying down    Pain  Relieving Factors meds              [facility] PT Assessment - 12/10/19 1441      Assessment   Medical Diagnosis M25.511,G89.29 (ICD-10-CM) - Chronic right shoulder pain; M54.2 (ICD-10-CM) - Neck pain     Referring Provider (PT) Hilts, Michael, MD    Onset Date/Surgical Date --   1 year, chronic x 5 years   Hand Dominance Right    Next MD Visit PRN    Prior Therapy none      Precautions   Precautions None      Restrictions   Weight Bearing Restrictions No      Balance Screen   Has the patient fallen in the past 6 months No    Has the patient had a decrease in activity level because of a fear of falling?  No    Is the patient reluctant to leave their home because of a fear of falling?  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    Leisure spend time with friends, walking in park, running, soccer      Cognition   Overall Cognitive Status Within Functional Limits for tasks assessed      Observation/Other Assessments   Focus on Therapeutic Outcomes (FOTO)  79 (21% limited; predicted 23% limited)      Posture/Postural Control   Posture/Postural Control Postural limitations    Postural Limitations Rounded Shoulders;Forward head;Increased thoracic kyphosis      ROM / Strength   AROM / PROM / Strength AROM;Strength      AROM   Overall AROM  Within functional limits for tasks performed      Strength   Strength Assessment Site Shoulder;Elbow;Hand    Right/Left Shoulder Right;Left    Right Shoulder Flexion 3/5    Right Shoulder ABduction 3+/5    Right Shoulder Internal Rotation 5/5    Right Shoulder External Rotation 4/5   with pain   Left Shoulder Flexion 5/5    Left Shoulder ABduction 5/5    Left Shoulder Internal Rotation 5/5    Left Shoulder External Rotation 5/5      Right/Left hand Right;Left    Right Hand Grip (lbs) 80.5   86, 82.2, 73.1   Left Hand Grip (lbs) 66.5   54.2, 78.4, 66.9     Palpation   Palpation comment active trigger points Rt upper trap, infraspinatus and rhomboids      Special Tests    Special Tests Cervical    Cervical Tests Spurling's      Spurling's   Findings Negative                      Objective measurements completed on examination: See above findings.       [facility] Adult PT Treatment/Exercise - 12/10/19 1441      Self-Care   Self-Care Other Self-Care Comments    Other Self-Care Comments  discussed HEP, pt performed 1-5 reps of each exercise      Manual Therapy   Manual Therapy Soft tissue mobilization    Manual therapy comments skilled palpation and monitoring of soft tissue during DN    Soft tissue mobilization Rt upper trap            Trigger Point Dry Needling - 12/10/19 1511    Consent Given? Yes    Education Handout Provided Yes    Muscles Treated Head and Neck Upper trapezius    Upper Trapezius Response Twitch reponse elicited                PT Education - 12/10/19 1512    Education Details HEP, DN    Person(s) Educated Patient    Methods Explanation;Demonstration;Handout    Comprehension Verbalized understanding;Returned demonstration;Need further instruction            PT Short Term Goals - 12/10/19 1554      PT SHORT TERM GOAL #1   Title independent with initial HEP    Status New    Target Date 12/24/19             PT Long Term Goals - 12/10/19 1555      PT LONG TERM GOAL #1   Title independent with final HEP    Status New    Target Date 01/21/20      PT LONG TERM GOAL #2   Title report pain < 4/10 with functional activity for improved function  Status New    Target Date 01/21/20      PT LONG TERM GOAL #3   Title FOTO score maintained in order to maximize function    Status New    Target Date 01/21/20      PT LONG TERM GOAL #4   Title  demonstrate 5/5 Rt shoulder strength for improved function    Status New    Target Date 01/21/20                  Plan - 12/10/19 1512    Clinical Impression Statement Pt is a 44 y/o male who presents to OPPT for Rt shoulder and neck pain.  Pt demonstrates decreased strength, postural abnormalities and active trigger points affecting function.  Will benefit from PT to address deficits listed.    Personal Factors and Comorbidities Comorbidity 1;Time since onset of injury/illness/exacerbation    Comorbidities DM    Examination-Activity Limitations Sleep;Carry;Reach Overhead;Lift    Examination-Participation Restrictions Occupation;Cleaning    Stability/Clinical Decision Making Evolving/Moderate complexity    Clinical Decision Making Moderate    Rehab Potential Good    PT Frequency 2x / week    PT Duration 6 weeks    PT Treatment/Interventions ADLs/Self Care Home Management;Cryotherapy;Electrical Stimulation;Moist Heat;Traction;Therapeutic exercise;Therapeutic activities;Functional mobility training;Ultrasound;Neuromuscular re-education;Patient/family education;Manual techniques;Taping;Dry needling    PT Next Visit Plan assess response to DN, continue rhomboids/infraspinatus, postural exercises    PT Home Exercise Plan Access Code: [email or web address]    Consulted and Agree with Plan of Care Patient           Patient will benefit from skilled therapeutic intervention in order to improve the following deficits and impairments:  Increased fascial restricitons, Increased muscle spasms, Decreased strength, Pain, Postural dysfunction  Visit Diagnosis: Abnormal posture - Plan: PT plan of care cert/re-cert  Muscle weakness (generalized) - Plan: PT plan of care cert/re-cert  Chronic right shoulder pain - Plan: PT plan of care cert/re-cert  Other symptoms and signs involving the musculoskeletal system - Plan: PT plan of care cert/re-cert     Problem List Patient Active Problem List    Diagnosis Date Noted  . Excessive sweating 08/03/2019  . Dyslipidemia 08/03/2019  . Type 2 diabetes mellitus with other specified complication (HCC) 06/29/2019  . Hyperlipidemia associated with type 2 diabetes mellitus (HCC) 06/29/2019  . GERD (gastroesophageal reflux disease) 06/29/2019      Clarita Crane, PT, DPT 12/10/19 3:58 PM    [facility] Physical Therapy [address] [place], [place], [address] Phone: [phone]   Fax:  [phone]  Name: Rodney Pennington MRN: [id number] Date of Birth: 1976/02/21

## 2019-12-10 NOTE — Patient Instructions (Signed)
Access Code: [email or web address] URL: [email or web address] Date: 12/10/2019 Prepared by: Moshe Cipro  Exercises Seated Upper Trapezius Stretch - 2 x daily - 7 x weekly - 3 reps - 1 sets - 30 sec hold Seated Levator Scapulae Stretch - 2 x daily - 7 x weekly - 1 reps - 1 sets - 30 sec hold Standing Backward Shoulder Rolls - 2 x daily - 7 x weekly - 10 reps - 1 sets Seated Scapular Retraction - 2 x daily - 7 x weekly - 10 reps - 1 sets - 5 sec hold  Patient Education Trigger Point Dry Needling

## 2019-12-14 ENCOUNTER — Encounter: Payer: Self-pay | Admitting: Internal Medicine

## 2019-12-14 ENCOUNTER — Other Ambulatory Visit: Payer: Self-pay | Admitting: Internal Medicine

## 2019-12-14 MED ORDER — CANAGLIFLOZIN 100 MG PO TABS: 100.0000 mg | ORAL_TABLET | Freq: Every day | ORAL | 6 refills | Status: DC

## 2019-12-14 NOTE — Telephone Encounter (Signed)
I did not see anything in the records regarding this request. I have called [facility] to get more information. I was told Rx is non-formulary. Please advise change Rx or prior authorization.

## 2019-12-20 ENCOUNTER — Encounter: Payer: Self-pay | Admitting: Family Medicine

## 2020-01-02 ENCOUNTER — Encounter: Payer: Self-pay | Admitting: Physical Therapy

## 2020-01-02 ENCOUNTER — Ambulatory Visit (INDEPENDENT_AMBULATORY_CARE_PROVIDER_SITE_OTHER): Payer: 59 | Admitting: Physical Therapy

## 2020-01-02 ENCOUNTER — Other Ambulatory Visit: Payer: Self-pay

## 2020-01-02 DIAGNOSIS — R293 Abnormal posture: Secondary | ICD-10-CM

## 2020-01-02 DIAGNOSIS — R29898 Other symptoms and signs involving the musculoskeletal system: Secondary | ICD-10-CM | POA: Diagnosis not present

## 2020-01-02 DIAGNOSIS — M25511 Pain in right shoulder: Secondary | ICD-10-CM

## 2020-01-02 DIAGNOSIS — M6281 Muscle weakness (generalized): Secondary | ICD-10-CM

## 2020-01-02 DIAGNOSIS — G8929 Other chronic pain: Secondary | ICD-10-CM

## 2020-01-02 NOTE — Therapy (Signed)
[facility] Physical Therapy [address] [place], [place], [address] Phone: [phone]   Fax:  [phone]  Physical Therapy Treatment  Patient Details  Name: Rodney Pennington MRN: [id number] Date of Birth: 23-Nov-1975 Referring Provider (PT): Eunice Blase, MD   Encounter Date: 01/02/2020   PT End of Session - 01/02/20 0835    Visit Number 2    Number of Visits 12    Date for PT Re-Evaluation 01/21/20    PT Start Time 0803    PT Stop Time 0841    PT Time Calculation (min) 38 min    Activity Tolerance Patient tolerated treatment well    Behavior During Therapy [facility] for tasks assessed/performed           Past Medical History:  Diagnosis Date   Chronic kidney disease    Diabetes mellitus without complication ([place])    GERD (gastroesophageal reflux disease)    History of chicken pox    Hx of eating disorder    Hyperlipidemia     Past Surgical History:  Procedure Laterality Date   ERCP  2017   calcular cholecystitis    There were no vitals filed for this visit.   Subjective Assessment - 01/02/20 0806    Subjective Rt shoulder is feeling better - hasn't been to PT in 3 weeks due to schedule limitations.    Pertinent History DM, GERD    Limitations Lifting;House hold activities    Diagnostic tests xrays: negative    Patient Stated Goals improve pain, strength    Currently in Pain? No/denies                             [facility] Adult PT Treatment/Exercise - 01/02/20 0807      Exercises   Exercises Shoulder;Neck      Neck Exercises: Theraband   Shoulder Extension 20 reps;Blue    Rows 20 reps;Blue      Neck Exercises: Seated   Shoulder Rolls Backwards;20 reps    Other Seated Exercise scapular retraction 20 x 5 sec hold      Shoulder Exercises: ROM/Strengthening   UBE (Upper Arm Bike) L4.5 x 6 min (3' each direction)      Manual Therapy   Manual Therapy Soft tissue mobilization    Manual therapy comments  skilled palpation and monitoring of soft tissue during DN    Soft tissue mobilization Rt upper trap and cervical paraspinals      Neck Exercises: Stretches   Upper Trapezius Stretch Right;3 reps;30 seconds    Levator Stretch Right;3 reps;30 seconds            Trigger Point Dry Needling - 01/02/20 0820    Consent Given? Yes    Education Handout Provided Previously provided    Muscles Treated Head and Neck Upper trapezius;Splenius capitus;Semispinalis capitus    Upper Trapezius Response Twitch reponse elicited    Splenius capitus Response Twitch reponse elicited    Semispinalis capitus Response Twitch reponse elicited                  PT Short Term Goals - 01/02/20 0836      PT SHORT TERM GOAL #1   Title independent with initial HEP    Baseline 11/3: min cues - reports compliance    Status Partially Met    Target Date 12/24/19             PT Long Term Goals - 01/02/20  0836      PT LONG TERM GOAL #1   Title independent with final HEP    Status On-going    Target Date 01/21/20      PT LONG TERM GOAL #2   Title report pain < 4/10 with functional activity for improved function    Status On-going      PT LONG TERM GOAL #3   Title FOTO score maintained in order to maximize function    Status On-going      PT LONG TERM GOAL #4   Title demonstrate 5/5 Rt shoulder strength for improved function    Status On-going                 Plan - 01/02/20 0836    Clinical Impression Statement Pt reports improvement in pain and symptoms after last session, and DN repeated again today with positive response.  Min cues needed for HEP review but he is reporting compliance so STG partially met.  All other goals ongoing at this time.    Personal Factors and Comorbidities Comorbidity 1;Time since onset of injury/illness/exacerbation    Comorbidities DM    Examination-Activity Limitations Sleep;Carry;Reach Overhead;Lift    Examination-Participation Restrictions  Occupation;Cleaning    Stability/Clinical Decision Making Evolving/Moderate complexity    Rehab Potential Good    PT Frequency 2x / week    PT Duration 6 weeks    PT Treatment/Interventions ADLs/Self Care Home Management;Cryotherapy;Electrical Stimulation;Moist Heat;Traction;Therapeutic exercise;Therapeutic activities;Functional mobility training;Ultrasound;Neuromuscular re-education;Patient/family education;Manual techniques;Taping;Dry needling    PT Next Visit Plan assess response to DN, continue rhomboids/infraspinatus, postural exercises    PT Home Exercise Plan Access Code: [email or web address]    Consulted and Agree with Plan of Care Patient           Patient will benefit from skilled therapeutic intervention in order to improve the following deficits and impairments:  Increased fascial restricitons, Increased muscle spasms, Decreased strength, Pain, Postural dysfunction  Visit Diagnosis: Abnormal posture  Muscle weakness (generalized)  Chronic right shoulder pain  Other symptoms and signs involving the musculoskeletal system     Problem List Patient Active Problem List   Diagnosis Date Noted   Excessive sweating 08/03/2019   Dyslipidemia 08/03/2019   Type 2 diabetes mellitus with other specified complication ([place]) 59/16/3846   Hyperlipidemia associated with type 2 diabetes mellitus ([place]) 06/29/2019   GERD (gastroesophageal reflux disease) 06/29/2019      Laureen Abrahams, PT, DPT 01/02/20 8:45 AM    [facility] Physical Therapy [address] [place], [place], [address] Phone: [phone]   Fax:  [phone]  Name: Rodney Pennington MRN: [id number] Date of Birth: [birth year]

## 2020-01-10 ENCOUNTER — Ambulatory Visit (INDEPENDENT_AMBULATORY_CARE_PROVIDER_SITE_OTHER): Payer: 59 | Admitting: Physical Therapy

## 2020-01-10 ENCOUNTER — Other Ambulatory Visit: Payer: Self-pay

## 2020-01-10 ENCOUNTER — Encounter: Payer: Self-pay | Admitting: Physical Therapy

## 2020-01-10 DIAGNOSIS — G8929 Other chronic pain: Secondary | ICD-10-CM

## 2020-01-10 DIAGNOSIS — M6281 Muscle weakness (generalized): Secondary | ICD-10-CM | POA: Diagnosis not present

## 2020-01-10 DIAGNOSIS — R29898 Other symptoms and signs involving the musculoskeletal system: Secondary | ICD-10-CM | POA: Diagnosis not present

## 2020-01-10 DIAGNOSIS — M25511 Pain in right shoulder: Secondary | ICD-10-CM | POA: Diagnosis not present

## 2020-01-10 DIAGNOSIS — R293 Abnormal posture: Secondary | ICD-10-CM | POA: Diagnosis not present

## 2020-01-10 NOTE — Therapy (Addendum)
[facility] Physical Therapy [address] [place], [place], [address] Phone: [phone]   Fax:  [phone]  Physical Therapy Treatment/Discharge Summary  Patient Details  Name: Rodney Pennington MRN: [id number] Date of Birth: [birth year] Referring Provider (PT): Eunice Blase, MD   Encounter Date: 01/10/2020   PT End of Session - 01/10/20 0841    Visit Number 3    Number of Visits 12    Date for PT Re-Evaluation 01/21/20    PT Start Time 0807   pt arrived late   PT Stop Time 0840    PT Time Calculation (min) 33 min    Activity Tolerance Patient tolerated treatment well    Behavior During Therapy [facility] for tasks assessed/performed           Past Medical History:  Diagnosis Date  . Chronic kidney disease   . Diabetes mellitus without complication ([place])   . GERD (gastroesophageal reflux disease)   . History of chicken pox   . Hx of eating disorder   . Hyperlipidemia     Past Surgical History:  Procedure Laterality Date  . ERCP  2017   calcular cholecystitis    There were no vitals filed for this visit.   Subjective Assessment - 01/10/20 0808    Subjective Rt shoulder feels better overall - has some pain with extended overhead work, but it isn't as bad    Pertinent History DM, GERD    Limitations Lifting;House hold activities    Diagnostic tests xrays: negative    Patient Stated Goals improve pain, strength    Currently in Pain? No/denies                             [facility] Adult PT Treatment/Exercise - 01/10/20 0809      Shoulder Exercises: Standing   Horizontal ABduction Both;Theraband    Theraband Level (Shoulder Horizontal ABduction) Level 4 (Blue)    Horizontal ABduction Limitations 3x10    External Rotation Both    External Rotation Weight (lbs) 2    External Rotation Limitations 3x10    Flexion Both;Weights    Shoulder Flexion Weight (lbs) 2    Flexion Limitations 3x10    ABduction Both;Weights    Shoulder  ABduction Weight (lbs) 2    ABduction Limitations 3x10    Row Both;Theraband    Theraband Level (Shoulder Row) Level 4 (Blue)    Row Limitations 3x10    Other Standing Exercises bicep curls 3x10, 4# each    Other Standing Exercises bil external rotation with scap ret 3x10; L4 band      Shoulder Exercises: ROM/Strengthening   UBE (Upper Arm Bike) L5 x 6 min (3' each direction)                  PT Education - 01/10/20 0841    Education Details added strengthening to HEP    Methods Explanation;Demonstration;Handout    Comprehension Verbalized understanding;Returned demonstration;Need further instruction            PT Short Term Goals - 01/02/20 0836      PT SHORT TERM GOAL #1   Title independent with initial HEP    Baseline 11/3: min cues - reports compliance    Status Partially Met    Target Date 12/24/19             PT Long Term Goals - 01/02/20 0836      PT LONG TERM GOAL #1  Title independent with final HEP    Status On-going    Target Date 01/21/20      PT LONG TERM GOAL #2   Title report pain < 4/10 with functional activity for improved function    Status On-going      PT LONG TERM GOAL #3   Title FOTO score maintained in order to maximize function    Status On-going      PT LONG TERM GOAL #4   Title demonstrate 5/5 Rt shoulder strength for improved function    Status On-going                 Plan - 01/10/20 0841    Clinical Impression Statement Pt reporting minimal pain today and improvement in symptoms overall.  Has some fatigue and weakness noted, so added strengthening to HEP.  Deferred DN today as he doesn't have pain. Will continue to benefit from PT to maximize function.    Personal Factors and Comorbidities Comorbidity 1;Time since onset of injury/illness/exacerbation    Comorbidities DM    Examination-Activity Limitations Sleep;Carry;Reach Overhead;Lift    Examination-Participation Restrictions Occupation;Cleaning     Stability/Clinical Decision Making Evolving/Moderate complexity    Rehab Potential Good    PT Frequency 2x / week    PT Duration 6 weeks    PT Treatment/Interventions ADLs/Self Care Home Management;Cryotherapy;Electrical Stimulation;Moist Heat;Traction;Therapeutic exercise;Therapeutic activities;Functional mobility training;Ultrasound;Neuromuscular re-education;Patient/family education;Manual techniques;Taping;Dry needling    PT Next Visit Plan postural exercises, see how strengthening exercises are going    PT Home Exercise Plan Access Code: [email or web address]    Consulted and Agree with Plan of Care Patient           Patient will benefit from skilled therapeutic intervention in order to improve the following deficits and impairments:  Increased fascial restricitons, Increased muscle spasms, Decreased strength, Pain, Postural dysfunction  Visit Diagnosis: Abnormal posture  Muscle weakness (generalized)  Chronic right shoulder pain  Other symptoms and signs involving the musculoskeletal system     Problem List Patient Active Problem List   Diagnosis Date Noted  . Excessive sweating 08/03/2019  . Dyslipidemia 08/03/2019  . Type 2 diabetes mellitus with other specified complication ([place]) 14/48/1856  . Hyperlipidemia associated with type 2 diabetes mellitus ([place]) 06/29/2019  . GERD (gastroesophageal reflux disease) 06/29/2019      Laureen Abrahams, PT, DPT 01/10/20 8:43 AM    [facility] Physical Therapy [address] [place], [place], [address] Phone: [phone]   Fax:  [phone]  Name: Rodney Pennington MRN: [id number] Date of Birth: 12/25/1975     PHYSICAL THERAPY DISCHARGE SUMMARY  Visits from Start of Care: 3  Current functional level related to goals / functional outcomes: See above   Remaining deficits: unknown   Education / Equipment: HEP  Plan: Patient agrees to discharge.  Patient goals were not met. Patient is being  discharged due to not returning since the last visit.  ?????     Laureen Abrahams, PT, DPT 03/03/20 11:35 AM  [facility] Physical Therapy [address] [place], [place], [address] Phone: [phone]   Fax:  [phone]

## 2020-01-10 NOTE — Patient Instructions (Signed)
Access Code: [email or web address] URL: [email or web address] Date: 01/10/2020 Prepared by: Moshe Cipro  Exercises Seated Upper Trapezius Stretch - 2 x daily - 7 x weekly - 3 reps - 1 sets - 30 sec hold Seated Levator Scapulae Stretch - 2 x daily - 7 x weekly - 1 reps - 1 sets - 30 sec hold Standing Backward Shoulder Rolls - 2 x daily - 7 x weekly - 10 reps - 1 sets Seated Scapular Retraction - 2 x daily - 7 x weekly - 10 reps - 1 sets - 5 sec hold Standing Shoulder Flexion to 90 Degrees with Dumbbells - 1 x daily - 7 x weekly - 3 sets - 10 reps Shoulder Abduction with Dumbbells - Thumbs Up - 1 x daily - 7 x weekly - 3 sets - 10 reps Standing Bicep Curls Supinated with Dumbbells - 1 x daily - 7 x weekly - 3 sets - 10 reps Shoulder External Rotation with Dumbbells - 1 x daily - 7 x weekly - 3 sets - 10 reps Standing Row with Anchored Resistance - 1 x daily - 7 x weekly - 3 sets - 10 reps Standing Shoulder Horizontal Abduction with Resistance - 1 x daily - 7 x weekly - 3 sets - 10 reps - 1-2 sec hold  Patient Education Trigger Point Dry Needling

## 2020-01-16 ENCOUNTER — Encounter: Payer: 59 | Admitting: Rehabilitative and Restorative Service Providers"

## 2020-01-22 ENCOUNTER — Ambulatory Visit: Payer: 59 | Admitting: Family Medicine

## 2020-01-23 ENCOUNTER — Telehealth: Payer: Self-pay | Admitting: Physical Therapy

## 2020-01-23 ENCOUNTER — Encounter: Payer: 59 | Admitting: Physical Therapy

## 2020-01-23 NOTE — Telephone Encounter (Signed)
LVM for pt as he NS for PT appt today.  Advised this is last scheduled appt and to call office if he needed additional PT.  Clarita Crane, PT, DPT 01/23/20 8:26 AM

## 2020-01-26 ENCOUNTER — Other Ambulatory Visit: Payer: Self-pay | Admitting: Family Medicine

## 2020-01-26 DIAGNOSIS — K219 Gastro-esophageal reflux disease without esophagitis: Secondary | ICD-10-CM

## 2020-01-26 DIAGNOSIS — R000 Tachycardia, unspecified: Secondary | ICD-10-CM

## 2020-02-01 ENCOUNTER — Ambulatory Visit: Payer: 59 | Admitting: Family Medicine

## 2020-02-25 ENCOUNTER — Ambulatory Visit: Payer: 59 | Admitting: Family Medicine

## 2020-02-26 ENCOUNTER — Encounter: Payer: 59 | Admitting: Family Medicine

## 2020-03-10 ENCOUNTER — Ambulatory Visit (INDEPENDENT_AMBULATORY_CARE_PROVIDER_SITE_OTHER): Payer: 59 | Admitting: Family Medicine

## 2020-03-10 ENCOUNTER — Encounter: Payer: Self-pay | Admitting: Family Medicine

## 2020-03-10 ENCOUNTER — Other Ambulatory Visit: Payer: Self-pay

## 2020-03-10 VITALS — BP 120/74 | HR 98 | Resp 16 | Ht 66.8 in | Wt 166.1 lb

## 2020-03-10 DIAGNOSIS — Z23 Encounter for immunization: Secondary | ICD-10-CM | POA: Diagnosis not present

## 2020-03-10 DIAGNOSIS — E1169 Type 2 diabetes mellitus with other specified complication: Secondary | ICD-10-CM

## 2020-03-10 DIAGNOSIS — Z1211 Encounter for screening for malignant neoplasm of colon: Secondary | ICD-10-CM | POA: Diagnosis not present

## 2020-03-10 DIAGNOSIS — Z0000 Encounter for general adult medical examination without abnormal findings: Secondary | ICD-10-CM

## 2020-03-10 DIAGNOSIS — R000 Tachycardia, unspecified: Secondary | ICD-10-CM

## 2020-03-10 DIAGNOSIS — E785 Hyperlipidemia, unspecified: Secondary | ICD-10-CM | POA: Diagnosis not present

## 2020-03-10 LAB — COMPREHENSIVE METABOLIC PANEL
ALT: 17 U/L (ref 0–53)
AST: 16 U/L (ref 0–37)
Albumin: 4.5 g/dL (ref 3.5–5.2)
Alkaline Phosphatase: 86 U/L (ref 39–117)
BUN: 11 mg/dL (ref 6–23)
CO2: 33 mEq/L — ABNORMAL HIGH (ref 19–32)
Calcium: 9.6 mg/dL (ref 8.4–10.5)
Chloride: 99 mEq/L (ref 96–112)
Creatinine, Ser: 0.65 mg/dL (ref 0.40–1.50)
GFR: 114.19 mL/min (ref >60.00)
Glucose, Bld: 151 mg/dL — ABNORMAL HIGH (ref 70–99)
Potassium: 4.6 mEq/L (ref 3.5–5.1)
Sodium: 138 mEq/L (ref 135–145)
Total Bilirubin: 0.5 mg/dL (ref 0.2–1.2)
Total Protein: 7.1 g/dL (ref 6.0–8.3)

## 2020-03-10 LAB — LIPID PANEL
Cholesterol: 183 mg/dL (ref 0–200)
HDL: 45.1 mg/dL (ref >39.00)
NonHDL: 137.88
Total CHOL/HDL Ratio: 4
Triglycerides: 304 mg/dL — ABNORMAL HIGH (ref 0.0–149.0)
VLDL: 60.8 mg/dL — ABNORMAL HIGH (ref 0.0–40.0)

## 2020-03-10 LAB — CBC
HCT: 46.4 % (ref 39.0–52.0)
Hemoglobin: 15.7 g/dL (ref 13.0–17.0)
MCHC: 33.9 g/dL (ref 30.0–36.0)
MCV: 86.3 fl (ref 78.0–100.0)
Platelets: 205 10*3/uL (ref 150.0–400.0)
RBC: 5.37 Mil/uL (ref 4.22–5.81)
RDW: 12.7 % (ref 11.5–15.5)
WBC: 5.2 10*3/uL (ref 4.0–10.5)

## 2020-03-10 LAB — TSH: TSH: 1.35 u[IU]/mL (ref 0.35–4.50)

## 2020-03-10 LAB — T4, FREE: Free T4: 0.96 ng/dL (ref 0.60–1.60)

## 2020-03-10 LAB — LDL CHOLESTEROL, DIRECT: Direct LDL: 98 mg/dL

## 2020-03-10 NOTE — Patient Instructions (Addendum)
A few things to remember from today's visit:  Routine general medical examination at a health care facility  Hyperlipidemia associated with type 2 diabetes mellitus (HCC) - Plan: Lipid panel, CMP  Colon cancer screening - Plan: Ambulatory referral to Gastroenterology  Sinus tachycardia - Plan: CBC, TSH, T4, free  If labs are normal I will place referral to heart doctor.  Please be sure medication list is accurate. If a new problem present, please set up appointment sooner than planned today.  At least 150 minutes of moderate exercise per week, daily brisk walking for 15-30 min is a good exercise option. Healthy diet low in saturated (animal) fats and sweets and consisting of fresh fruits and vegetables, lean meats such as fish and white chicken and whole grains.  - Vaccines:  Tdap vaccine every 10 years.  Shingles vaccine recommended at age 45, could be given after 45 years of age but not sure about insurance coverage.  Pneumonia vaccines: Pneumovax at 45  -Screening recommendations for low/normal risk males:  Screening for diabetes at age 45 and every 3 years. Earlier screening if cardiovascular risk factors.N/A   Lipid screening at 45 and every 3 years. Screening starts in younger males with cardiovascular risk factors.N/A  Colon cancer screening is now at age 45 but your insurance may not cover until age 52 .screening is recommended age 33.  Prostate cancer screening: some controversy, starts usually at 50: Rectal exam and PSA.  Aortic Abdominal Aneurism once between 56 and 76 years old if ever smoker.  Also recommended:  1. Dental visit- Brush and floss your teeth twice daily; visit your dentist twice a year. 2. Eye doctor- Get an eye exam at least every 2 years. 3. Helmet use- Always wear a helmet when riding a bicycle, motorcycle, rollerblading or skateboarding. 4. Safe sex- If you may be exposed to sexually transmitted infections, use a condom. 5. Seat belts- Seat  belts can save your live; always wear one. 6. Smoke/Carbon Monoxide detectors- These detectors need to be installed on the appropriate level of your home. Replace batteries at least once a year. 7. Skin cancer- When out in the sun please cover up and use sunscreen 15 SPF or higher. 8. Violence- If anyone is threatening or hurting you, please tell your healthcare provider.  9. Drink alcohol in moderation- Limit alcohol intake to one drink or less per day. Never drink and drive.

## 2020-03-10 NOTE — Progress Notes (Signed)
HPI: [name] is a 44 y.o.male here today for his routine physical examination. Last CPE: > a year ago. He lives with his wife and 3 children.  Regular exercise 3 or more times per week: He has not been consistent for the past 2-3 months. Following a healthy diet: He is doing better.His wife cooks, he eats vegetables, low carb and low sugar diet.  Chronic medical problems: DM II,HLD, and GERD. Pending appt with GI due to GI due to recurrent symptoms despite PPI treatment.  Immunization History  Administered Date(s) Administered  . Janssen (J&J) SARS-COV-2 Vaccination 05/28/2019, 05/28/2019  . Pneumococcal Polysaccharide-23 03/10/2020  . Tdap 03/10/2020   Hep C screening: 11/01/19 NR Last colon cancer screening: Never. Last prostate ca screening: N/A  Negative for high alcohol intake and tobacco use.  -Concerns and/or follow up today: Elevated HR that he noted a few months ago. At rest 90's and with activity in the 100's. Exercise 120's. He has not noted associated CP,palpitations,or SOB. Negative for abnormal weight loss and tremor.  Lab Results  Component Value Date   WBC 5.8 06/02/2012   HGB 17.5 (H) 06/02/2012   HCT 46.6 06/02/2012   MCV 80.3 06/02/2012   PLT 194 06/02/2012   Lab Results  Component Value Date   TSH 0.40 08/03/2019   He was Diltiazem 60 mg bid, stopped, took medication for 2-3 months and did not noted much difference.  "Excessive" sweating for years, unchanged.  Hyperlipidemia: Currently he is on simvastatin 20 mg daily. Component     Latest Ref Rng & Units 11/02/2019  Cholesterol     0 - 200 mg/dL 169  HDL Cholesterol     >39.00 mg/dL 41  Triglycerides     0.0 - 149.0 mg/dL 413 (H)  LDL Cholesterol (Calc)     mg/dL (calc)   Total CHOL/HDL Ratio      4.1  Non-HDL Cholesterol (Calc)     <130 mg/dL (calc) 128   TG 205 on 11/27/19.  Review of Systems  Constitutional: Negative for activity change, appetite change, fatigue  and fever.  HENT: Negative for dental problem, nosebleeds, sore throat and trouble swallowing.   Eyes: Negative for redness and visual disturbance.  Respiratory: Negative for cough, shortness of breath and wheezing.   Cardiovascular: Negative for chest pain, palpitations and leg swelling.  Gastrointestinal: Negative for abdominal pain, blood in stool, nausea and vomiting.  Endocrine: Negative for cold intolerance, heat intolerance, polydipsia, polyphagia and polyuria.  Genitourinary: Negative for decreased urine volume, discharge, dysuria, genital sores, hematuria and testicular pain.  Musculoskeletal: Negative for gait problem and myalgias. + Arthralgias. Skin: Negative for color change and rash.  Allergic/Immunologic: Negative for environmental allergies.  Neurological: Negative for syncope, weakness and headaches.  Hematological: Negative for adenopathy. Does not bruise/bleed easily.  Psychiatric/Behavioral: Negative for confusion. The patient is not nervous/anxious.   All other systems reviewed and are negative.  Current Outpatient Medications on File Prior to Visit  Medication Sig Dispense Refill  . Accu-Chek Softclix Lancets lancets Use to test blood sugar 3 times daily. 100 each 12  . blood glucose meter kit and supplies Use to test blood sugar 3 times daily. 1 each 0  . Blood Glucose Monitoring Suppl (ACCU-CHEK GUIDE ME) w/Device KIT Use to test blood sugar 3 times daily 1 kit 0  . canagliflozin (INVOKANA) 100 MG TABS tablet Take 1 tablet (100 mg total) by mouth daily before breakfast. 30 tablet 6  . glucose blood (  ACCU-CHEK GUIDE) test strip Use to test blood sugar 3 times daily. 100 each 12  . insulin glargine (LANTUS SOLOSTAR) 100 UNIT/ML Solostar Pen Inject 14 Units into the skin daily. (Patient taking differently: Inject 10 Units into the skin daily. ) 15 mL 11  . Insulin Pen Needle 32G X 4 MM MISC 1 Device by Does not apply route daily. 50 each 11  . metFORMIN (GLUCOPHAGE) 500  MG tablet Take 1 tablet (500 mg total) by mouth 3 (three) times daily. 270 tablet 3  . Multiple Vitamin (MULTIVITAMIN) tablet Take 1 tablet by mouth daily.    . pantoprazole (PROTONIX) 40 MG tablet TAKE 1 TABLET(40 MG) BY MOUTH DAILY 90 tablet 1  . simvastatin (ZOCOR) 20 MG tablet Take 20 mg by mouth daily at 6 PM.    . TRULICITY 1.5 KM/6.2MM SOPN Inject 1.5 mg into the skin once a week.     No current facility-administered medications on file prior to visit.   Past Medical History:  Diagnosis Date  . Chronic kidney disease   . Diabetes mellitus without complication ([place])   . GERD (gastroesophageal reflux disease)   . History of chicken pox   . Hx of eating disorder   . Hyperlipidemia    Past Surgical History:  Procedure Laterality Date  . ERCP  2017   calcular cholecystitis   No Known Allergies  Family History  Problem Relation Age of Onset  . Arthritis Mother   . Heart attack Mother   . Hyperlipidemia Mother   . Kidney disease Mother   . Arthritis Father   . Arthritis Sister   . Hypertension Paternal Uncle   . Hearing loss Sister   . Hypertension Sister   . Kidney disease Sister   . Diabetes Brother    Social History   Socioeconomic History  . Marital status: Married    Spouse name: Not on file  . Number of children: Not on file  . Years of education: Not on file  . Highest education level: Not on file  Occupational History  . Not on file  Tobacco Use  . Smoking status: Never Smoker  . Smokeless tobacco: Never Used  Vaping Use  . Vaping Use: Never used  Substance and Sexual Activity  . Alcohol use: No  . Drug use: Never  . Sexual activity: Yes  Other Topics Concern  . Not on file  Social History Narrative  . Not on file   Social Determinants of Health   Financial Resource Strain: Not on file  Food Insecurity: Not on file  Transportation Needs: Not on file  Physical Activity: Not on file  Stress: Not on file  Social Connections: Not on file     Today's Vitals   03/10/20 0708  BP: 120/74  Pulse: 98  Resp: 16  SpO2: 100%  Weight: 166 lb 2 oz (75.4 kg)  Height: 5' 6.8" (1.697 m)   Body mass index is 26.17 kg/m.  Wt Readings from Last 3 Encounters:  03/10/20 166 lb 2 oz (75.4 kg)  11/27/19 165 lb 8 oz (75.1 kg)  11/16/19 165 lb 3.2 oz (74.9 kg)   Physical Exam  Nursing note and vitals reviewed. Constitutional: He is oriented to person, place, and time. He appears well-developed and well-nourished. No distress.  HENT:  Head: Normocephalic and atraumatic.  Right Ear: Tympanic membrane, external ear and ear canal normal.  Left Ear: Tympanic membrane, external ear and ear canal normal.  Mouth/Throat: Oropharynx is clear and  moist and mucous membranes are normal.  Eyes: Pupils are equal, round, and reactive to light. Conjunctivae and EOM are normal.  Neck: Normal range of motion. No tracheal deviation present. No thyromegaly present.  Cardiovascular: Normal rate and regular rhythm.  No murmur heard. Pulses:      Dorsalis pedis pulses are 2+ on the right side and 2+ on the left side.  Respiratory: Effort normal and breath sounds normal. No respiratory distress.  GI: Soft. He exhibits no mass. There is no hepatomegaly. There is no abdominal tenderness.  Genitourinary:    Genitourinary Comments: No concerns.   Musculoskeletal:        General: No tenderness or edema.     Comments: No major deformities appreciated and no signs of synovitis.  Lymphadenopathy:    He has no cervical adenopathy.       Right: No supraclavicular adenopathy present.       Left: No supraclavicular adenopathy present.  Neurological: He is alert and oriented to person, place, and time. He has normal strength. No cranial nerve deficit or sensory deficit. Coordination and gait normal.  Reflex Scores:      Bicep reflexes are 2+ on the right side and 2+ on the left side.      Patellar reflexes are 2+ on the right side and 2+ on the left side. Skin:  Skin is warm. No erythema.  Psychiatric: He has a normal mood and affect. Cognition and memory are normal.  Well groomed,good eye contact.   ASSESSMENT AND PLAN:  [name] was here today annual physical examination.  Diagnoses and all orders for this visit: Orders Placed This Encounter  Procedures  . Tdap vaccine greater than or equal to 7yo IM  . Pneumococcal polysaccharide vaccine 23-valent greater than or equal to 2yo subcutaneous/IM  . CBC  . Lipid panel  . CMP  . TSH  . T4, free  . LDL cholesterol, direct  . Ambulatory referral to Gastroenterology   Lab Results  Component Value Date   CHOL 183 03/10/2020   HDL 45.10 03/10/2020   [place]  11/02/2019     Comment:     . LDL cholesterol not calculated. Triglyceride levels greater than 400 mg/dL invalidate calculated LDL results. . Reference range: <100 . Desirable range <100 mg/dL for primary prevention;   <70 mg/dL for patients with CHD or diabetic patients  with > or = 2 CHD risk factors. Marland Kitchen LDL-C is now calculated using the Martin-Hopkins  calculation, which is a validated novel method providing  better accuracy than the Friedewald equation in the  estimation of LDL-C.  Cresenciano Genre et al. Annamaria Helling. 1914;782(95): 2061-2068  (http://education.[facility].com/faq/FAQ164)    LDLDIRECT 98.0 03/10/2020   TRIG 304.0 (H) 03/10/2020   CHOLHDL 4 03/10/2020   Lab Results  Component Value Date   CREATININE 0.65 03/10/2020   BUN 11 03/10/2020   NA 138 03/10/2020   K 4.6 03/10/2020   CL 99 03/10/2020   CO2 33 (H) 03/10/2020   Lab Results  Component Value Date   WBC 5.2 03/10/2020   HGB 15.7 03/10/2020   HCT 46.4 03/10/2020   MCV 86.3 03/10/2020   PLT 205.0 03/10/2020   Lab Results  Component Value Date   TSH 1.35 03/10/2020    Routine general medical examination at a health care facility We discussed the importance of regular physical activity and healthy diet for prevention of chronic  illness and/or complications. Preventive guidelines reviewed. Vaccination updated.  Next CPE in  a year.  Hyperlipidemia associated with type 2 diabetes mellitus (HCC) Continue simvastatin 20 mg daily. Further recommendation will be given according to lipid panel results.  Colon cancer screening -     Ambulatory referral to Gastroenterology  Sinus tachycardia We discussed possible etiologies. History and examination do not suggest a serious process. He is very concerned , so if blood work done today is otherwise negative, cardiology referral will be placed. Instructed about warning signs. Continue monitoring HR.  Need for Tdap vaccination -     Tdap vaccine greater than or equal to 7yo IM  Need for pneumococcal vaccination -     Pneumococcal polysaccharide vaccine 23-valent greater than or equal to 2yo subcutaneous/IM  Other orders -     LDL cholesterol, direct  Return in 1 year (on 03/10/2021) for cpe.   Adil Tugwell G. Martinique, MD  [facility]. [place] office.   A few things to remember from today's visit:  Routine general medical examination at a health care facility  Hyperlipidemia associated with type 2 diabetes mellitus ([place]) - Plan: Lipid panel, CMP  Colon cancer screening - Plan: Ambulatory referral to Gastroenterology  Sinus tachycardia - Plan: CBC, TSH, T4, free  If labs are normal I will place referral to heart doctor.  Please be sure medication list is accurate. If a new problem present, please set up appointment sooner than planned today.  At least 150 minutes of moderate exercise per week, daily brisk walking for 15-30 min is a good exercise option. Healthy diet low in saturated (animal) fats and sweets and consisting of fresh fruits and vegetables, lean meats such as fish and white chicken and whole grains.  - Vaccines:  Tdap vaccine every 10 years.  Shingles vaccine recommended at age 86, could be given after 45 years of age but not sure  about insurance coverage.  Pneumonia vaccines: Pneumovax at 30  -Screening recommendations for low/normal risk males:  Screening for diabetes at age 31 and every 3 years. Earlier screening if cardiovascular risk factors.N/A   Lipid screening at 35 and every 3 years. Screening starts in younger males with cardiovascular risk factors.N/A  Colon cancer screening is now at age 65 but your insurance may not cover until age 68 .screening is recommended age 46.  Prostate cancer screening: some controversy, starts usually at 34: Rectal exam and PSA.  Aortic Abdominal Aneurism once between 52 and 44 years old if ever smoker.  Also recommended:  1. Dental visit- Brush and floss your teeth twice daily; visit your dentist twice a year. 2. Eye doctor- Get an eye exam at least every 2 years. 3. Helmet use- Always wear a helmet when riding a bicycle, motorcycle, rollerblading or skateboarding. 4. Safe sex- If you may be exposed to sexually transmitted infections, use a condom. 5. Seat belts- Seat belts can save your live; always wear one. 6. Smoke/Carbon Monoxide detectors- These detectors need to be installed on the appropriate level of your home. Replace batteries at least once a year. 7. Skin cancer- When out in the sun please cover up and use sunscreen 15 SPF or higher. 8. Violence- If anyone is threatening or hurting you, please tell your healthcare provider.  9. Drink alcohol in moderation- Limit alcohol intake to one drink or less per day. Never drink and drive.

## 2020-03-11 ENCOUNTER — Encounter: Payer: Self-pay | Admitting: Family Medicine

## 2020-03-21 ENCOUNTER — Ambulatory Visit: Payer: 59 | Admitting: Internal Medicine

## 2020-03-26 ENCOUNTER — Other Ambulatory Visit: Payer: Self-pay | Admitting: Family Medicine

## 2020-03-27 NOTE — Progress Notes (Signed)
CARDIOLOGY CONSULT NOTE       Patient ID: Rodney Pennington MRN: [id number] DOB/AGE: [birth year] 45 y.o.  Admit date: (Not on file) Referring Physician: [place] Primary Physician: [place], Betty G, MD Primary Cardiologist: New Reason for Consultation: Tachycardia  Active Problems:   * No active hospital problems. *   HPI:  45 y.o. referred by Dr Martinique for tachycardia . History of DM, HLD, GERD He has noted HR in 90's at Rest for past few months up to 100's activity and 120's with exercise No associated symptoms of dyspnea Chest pain palpitations or syncope Did not notice improvement with Cardizem His TSH/T4 were normal 03/10/20 Hct 44.4 no anemia He is on statin for lipids and Lovaza added although DM not well controlled   He notes HR consistently higher last 6 months and slow to return down after activity No excess caffeine or stimulants Does not drink and does not seem anxious    ROS All other systems reviewed and negative except as noted above  Past Medical History:  Diagnosis Date  . Chronic kidney disease   . Diabetes mellitus without complication ([place])   . GERD (gastroesophageal reflux disease)   . History of chicken pox   . Hx of eating disorder   . Hyperlipidemia     Family History  Problem Relation Age of Onset  . Arthritis Mother   . Heart attack Mother   . Hyperlipidemia Mother   . Kidney disease Mother   . Arthritis Father   . Arthritis Sister   . Hypertension Paternal Uncle   . Hearing loss Sister   . Hypertension Sister   . Kidney disease Sister   . Diabetes Brother     Social History   Socioeconomic History  . Marital status: Married    Spouse name: Not on file  . Number of children: Not on file  . Years of education: Not on file  . Highest education level: Not on file  Occupational History  . Not on file  Tobacco Use  . Smoking status: Never Smoker  . Smokeless tobacco: Never Used  Vaping Use  . Vaping Use: Never used  Substance and  Sexual Activity  . Alcohol use: No  . Drug use: Never  . Sexual activity: Yes  Other Topics Concern  . Not on file  Social History Narrative  . Not on file   Social Determinants of Health   Financial Resource Strain: Not on file  Food Insecurity: Not on file  Transportation Needs: Not on file  Physical Activity: Not on file  Stress: Not on file  Social Connections: Not on file  Intimate Partner Violence: Not on file    Past Surgical History:  Procedure Laterality Date  . ERCP  2017   calcular cholecystitis      Current Outpatient Medications:  .  Accu-Chek Softclix Lancets lancets, Use to test blood sugar 3 times daily., Disp: 100 each, Rfl: 12 .  blood glucose meter kit and supplies, Use to test blood sugar 3 times daily., Disp: 1 each, Rfl: 0 .  Blood Glucose Monitoring Suppl (ACCU-CHEK GUIDE ME) w/Device KIT, Use to test blood sugar 3 times daily, Disp: 1 kit, Rfl: 0 .  canagliflozin (INVOKANA) 100 MG TABS tablet, Take 1 tablet (100 mg total) by mouth daily before breakfast., Disp: 30 tablet, Rfl: 6 .  glucose blood (ACCU-CHEK GUIDE) test strip, Use to test blood sugar 3 times daily., Disp: 100 each, Rfl: 12 .  insulin glargine (LANTUS  SOLOSTAR) 100 UNIT/ML Solostar Pen, Inject 14 Units into the skin daily., Disp: 15 mL, Rfl: 6 .  Insulin Pen Needle 32G X 4 MM MISC, 1 Device by Does not apply route daily., Disp: 50 each, Rfl: 11 .  metFORMIN (GLUCOPHAGE) 500 MG tablet, Take 1 tablet (500 mg total) by mouth 3 (three) times daily., Disp: 270 tablet, Rfl: 3 .  Multiple Vitamin (MULTIVITAMIN) tablet, Take 1 tablet by mouth daily., Disp: , Rfl:  .  pantoprazole (PROTONIX) 40 MG tablet, TAKE 1 TABLET(40 MG) BY MOUTH DAILY, Disp: 90 tablet, Rfl: 1 .  simvastatin (ZOCOR) 20 MG tablet, Take 20 mg by mouth daily at 6 PM., Disp: , Rfl:  .  TRULICITY 1.5 ZO/1.0RU SOPN, Inject 1.5 mg into the skin once a week., Disp: , Rfl:     Physical Exam: There were no vitals taken for this  visit.    Affect appropriate Healthy:  appears stated age 45: normal Neck supple with no adenopathy JVP normal no bruits no thyromegaly Lungs clear with no wheezing and good diaphragmatic motion Heart:  S1/S2 no murmur, no rub, gallop or click PMI normal Abdomen: benighn, BS positve, no tenderness, no AAA no bruit.  No HSM or HJR Distal pulses intact with no bruits No edema Neuro non-focal Skin warm and dry No muscular weakness   Labs:   Lab Results  Component Value Date   WBC 5.2 03/10/2020   HGB 15.7 03/10/2020   HCT 46.4 03/10/2020   MCV 86.3 03/10/2020   PLT 205.0 03/10/2020   No results for input(s): NA, K, CL, CO2, BUN, CREATININE, CALCIUM, PROT, BILITOT, ALKPHOS, ALT, AST, GLUCOSE in the last 168 hours.  Invalid input(s): LABALBU No results found for: CKTOTAL, CKMB, CKMBINDEX, TROPONINI  Lab Results  Component Value Date   CHOL 183 03/10/2020   CHOL 169 11/02/2019   CHOL 238 (H) 06/29/2019   Lab Results  Component Value Date   HDL 45.10 03/10/2020   HDL 41 11/02/2019   HDL 49 06/29/2019   Lab Results  Component Value Date   LDLCALC  11/02/2019     Comment:     . LDL cholesterol not calculated. Triglyceride levels greater than 400 mg/dL invalidate calculated LDL results. . Reference range: <100 . Desirable range <100 mg/dL for primary prevention;   <70 mg/dL for patients with CHD or diabetic patients  with > or = 2 CHD risk factors. Marland Kitchen LDL-C is now calculated using the Martin-Hopkins  calculation, which is a validated novel method providing  better accuracy than the Friedewald equation in the  estimation of LDL-C.  Cresenciano Genre et al. Annamaria Helling. 0454;098(11): 2061-2068  (http://education.[facility].com/faq/FAQ164)    LDLCALC 152 (H) 06/29/2019   Lab Results  Component Value Date   TRIG 304.0 (H) 03/10/2020   TRIG 205 (H) 11/27/2019   TRIG 413 (H) 11/02/2019   Lab Results  Component Value Date   CHOLHDL 4 03/10/2020   CHOLHDL 4.1  11/02/2019   CHOLHDL 4.9 06/29/2019   Lab Results  Component Value Date   LDLDIRECT 98.0 03/10/2020      Radiology: No results found.  EKG: NSR rate 76 normal 11/27/19    ASSESSMENT AND PLAN:   1. Tachycardia:  Seems functional no physical signs of pathology Labs ok Baseline ECG normal will F/u with TTE to r/o DCM or structural heart issue  See below regarding CAD risk stratification d/c  cardizem does not block SA node well Start lopressor 25 mg bid  2. DM:  Discussed low carb diet.  Target hemoglobin A1c is 6.5 or less.  Continue current medications. 3. HLD:  On statin tighter sugar control and weight loss advised 4. GERD:  On protonix f/u GI  5. CAD:  Risk family history DM continue statin ETT and coronary calcium score recommended see above As well   TTE ETT Calcium score Lopressor 25 bid  F/U post test  Signed: Jenkins Rouge 04/04/2020, 8:19 AM

## 2020-04-04 ENCOUNTER — Encounter: Payer: Self-pay | Admitting: Cardiovascular Disease

## 2020-04-04 ENCOUNTER — Ambulatory Visit: Payer: 59 | Admitting: Cardiovascular Disease

## 2020-04-04 ENCOUNTER — Other Ambulatory Visit: Payer: Self-pay

## 2020-04-04 VITALS — BP 118/62 | HR 92 | Ht 68.0 in | Wt 167.6 lb

## 2020-04-04 DIAGNOSIS — E785 Hyperlipidemia, unspecified: Secondary | ICD-10-CM

## 2020-04-04 DIAGNOSIS — R000 Tachycardia, unspecified: Secondary | ICD-10-CM

## 2020-04-04 MED ORDER — METOPROLOL TARTRATE 25 MG PO TABS: 25.0000 mg | ORAL_TABLET | Freq: Two times a day (BID) | ORAL | 3 refills | Status: DC

## 2020-04-04 NOTE — Patient Instructions (Addendum)
Medication Instructions:  Your physician has recommended you make the following change in your medication:  1-START Metoprolol 25 mg by mouth twice daily.  *If you need a refill on your cardiac medications before your next appointment, please call your pharmacy*  Lab Work: If you have labs (blood work) drawn today and your tests are completely normal, you will receive your results only by: Marland Kitchen MyChart Message (if you have MyChart) OR . A paper copy in the mail If you have any lab test that is abnormal or we need to change your treatment, we will call you to review the results.  Testing/Procedures: Your physician has requested that you have an echocardiogram. Echocardiography is a painless test that uses sound waves to create images of your heart. It provides your doctor with information about the size and shape of your heart and how well your heart's chambers and valves are working. This procedure takes approximately one hour. There are no restrictions for this procedure.  Cardiac CT scanning for calcium score, (CAT scanning), is a noninvasive, special x-ray that produces cross-sectional images of the body using x-rays and a computer. CT scans help physicians diagnose and treat medical conditions. For some CT exams, a contrast material is used to enhance visibility in the area of the body being studied. CT scans provide greater clarity and reveal more details than regular x-ray exams.  Your physician has requested that you have an exercise tolerance test. For further information please visit [email or web address] Please also follow instruction sheet, as given.  Follow-Up: At [facility], you and your health needs are our priority.  As part of our continuing mission to provide you with exceptional heart care, we have created designated Provider Care Teams.  These Care Teams include your primary Cardiologist (physician) and Advanced Practice Providers (APPs -  Physician Assistants and Nurse  Practitioners) who all work together to provide you with the care you need, when you need it.  We recommend signing up for the patient portal called "MyChart".  Sign up information is provided on this After Visit Summary.  MyChart is used to connect with patients for Virtual Visits (Telemedicine).  Patients are able to view lab/test results, encounter notes, upcoming appointments, etc.  Non-urgent messages can be sent to your provider as well.   To learn more about what you can do with MyChart, go to [email or web address].    Your next appointment:   1 month  The format for your next appointment:   In Person  Provider:   You may see Dr. Eden Emms or one of the following Advanced Practice Providers on your designated Care Team:    Georgie Chard, NP

## 2020-04-10 ENCOUNTER — Other Ambulatory Visit: Payer: Self-pay

## 2020-04-10 ENCOUNTER — Encounter: Payer: Self-pay | Admitting: Internal Medicine

## 2020-04-10 ENCOUNTER — Ambulatory Visit: Payer: 59 | Admitting: Internal Medicine

## 2020-04-10 VITALS — BP 132/82 | HR 90 | Ht 68.0 in | Wt 167.2 lb

## 2020-04-10 DIAGNOSIS — Z794 Long term (current) use of insulin: Secondary | ICD-10-CM

## 2020-04-10 DIAGNOSIS — E1169 Type 2 diabetes mellitus with other specified complication: Secondary | ICD-10-CM | POA: Diagnosis not present

## 2020-04-10 DIAGNOSIS — E1165 Type 2 diabetes mellitus with hyperglycemia: Secondary | ICD-10-CM

## 2020-04-10 LAB — POCT GLYCOSYLATED HEMOGLOBIN (HGB A1C): Hemoglobin A1C: 8 % — AB (ref 4.0–5.6)

## 2020-04-10 LAB — POCT GLUCOSE (DEVICE FOR HOME USE): POC Glucose: 244 mg/dl — AB (ref 70–99)

## 2020-04-10 MED ORDER — CANAGLIFLOZIN 300 MG PO TABS: 300.0000 mg | ORAL_TABLET | Freq: Every day | ORAL | 6 refills | Status: DC

## 2020-04-10 MED ORDER — LANTUS SOLOSTAR 100 UNIT/ML ~~LOC~~ SOPN: 14.0000 [IU] | PEN_INJECTOR | Freq: Every day | SUBCUTANEOUS | 6 refills | Status: DC

## 2020-04-10 NOTE — Patient Instructions (Signed)
-   Restatr Lantus 14 units daily  - Continue Metformin 500 mg, three tablets daily  - Increase Invokana 300 mg, 1 tablet daily      HOW TO TREAT LOW BLOOD SUGARS (Blood sugar LESS THAN 70 MG/DL)  Please follow the RULE OF 15 for the treatment of hypoglycemia treatment (when your (blood sugars are less than 70 mg/dL)    STEP 1: Take 15 grams of carbohydrates when your blood sugar is low, which includes:   3-4 GLUCOSE TABS  OR  3-4 OZ OF JUICE OR REGULAR SODA OR  ONE TUBE OF GLUCOSE GEL     STEP 2: RECHECK blood sugar in 15 MINUTES STEP 3: If your blood sugar is still low at the 15 minute recheck --> then, go back to STEP 1 and treat AGAIN with another 15 grams of carbohydrates.

## 2020-04-10 NOTE — Progress Notes (Signed)
Name: Rodney Pennington  Age/ Sex: 45 y.o., male   MRN/ DOB: [id number], 12-25-1975     PCP: [place], Betty G, MD   Reason for Endocrinology Evaluation: Type 2 Diabetes Mellitus  Initial Endocrine Consultative Visit: 08/03/2019    PATIENT IDENTIFIER: Rodney Pennington is a 45 y.o. male with a past medical history of  T2DM, CKD, Hx of pancreatitis due to gallstones (S/P ERCP) , renal stones , fatty liver  and dyslipidemia .Marland Kitchen The patient has followed with Endocrinology clinic since 08/03/2019 for consultative assistance with management of his diabetes.  DIABETIC HISTORY:  [name] was diagnosed with DM in 2013. He was on Metformin 750 mg ER but he believes this wasn't absorbed so switched to 500 mg tabs and doing well. Januvia switched to Trulicity in 04/6679,PT was also on  Dimacron .   His hemoglobin A1c has ranged from 8.0% , peaking at 10.7% %  in 2021    Pt chronic GI issues in the form of cramps, pain and occasional regurgitation     On his initial visit to our clinic his A1c was 10.7% , he was on metformin, trulicity and Metformin , and no changes were made as he was recently started on this regimen.   trulicity was discontinued due to recurrent history of pancreatitis by 07/2019  SUBJECTIVE:   During the last visit (08/13/2019): A1c 10.7% we continued metformin, trulicity and Metformin   Today (04/10/2020): [name] is here for a follow up on diabetes management.  He checks his blood sugars occasionally . The patient has not had hypoglycemic episodes since the last clinic visit.   GI issues have been improving  He has been evaluated by Cardiology for tachycardia   In the past 2 weeks, has been out of Invokana and Lantus   Has noted polydipsia and frequency   HOME DIABETES REGIMEN:  - Lantus 14 units daily- not taking due to cost  - Metformin 500 mg, 1 tablet with Breakfast and 2 tablet with supper  - Invokana 100 mg daily      Statin: Yes ACE-I/ARB:  no    METER DOWNLOAD SUMMARY: Did not bring     DIABETIC COMPLICATIONS: Microvascular complications:    Denies: neuropathy, retinopathy and CKD  Last Eye Exam: Completed 2021  Macrovascular complications:    Denies: CAD, CVA, PVD   HISTORY:  Past Medical History:  Past Medical History:  Diagnosis Date  . Chronic kidney disease   . Diabetes mellitus without complication ([place])   . GERD (gastroesophageal reflux disease)   . History of chicken pox   . Hx of eating disorder   . Hyperlipidemia    Past Surgical History:  Past Surgical History:  Procedure Laterality Date  . ERCP  2017   calcular cholecystitis    Social History:  reports that he has never smoked. He has never used smokeless tobacco. He reports that he does not drink alcohol and does not use drugs. Family History:  Family History  Problem Relation Age of Onset  . Arthritis Mother   . Heart attack Mother   . Hyperlipidemia Mother   . Kidney disease Mother   . Arthritis Father   . Arthritis Sister   . Hypertension Paternal Uncle   . Hearing loss Sister   . Hypertension Sister   . Kidney disease Sister   . Diabetes Brother      HOME MEDICATIONS: Allergies as of 04/10/2020   No Known Allergies  Medication List       Accurate as of April 10, 2020  8:42 AM. If you have any questions, ask your nurse or doctor.        Accu-Chek Guide Me w/Device Kit Use to test blood sugar 3 times daily   Accu-Chek Guide test strip Generic drug: glucose blood Use to test blood sugar 3 times daily.   Accu-Chek Softclix Lancets lancets Use to test blood sugar 3 times daily.   atorvastatin 20 MG tablet Commonly known as: LIPITOR Take 20 mg by mouth daily.   blood glucose meter kit and supplies Use to test blood sugar 3 times daily.   canagliflozin 100 MG Tabs tablet Commonly known as: INVOKANA Take 1 tablet (100 mg total) by mouth daily before breakfast.   Insulin Pen Needle 32G X 4 MM  Misc 1 Device by Does not apply route daily.   Lantus SoloStar 100 UNIT/ML Solostar Pen Generic drug: insulin glargine Inject 14 Units into the skin daily.   metFORMIN 500 MG tablet Commonly known as: GLUCOPHAGE Take 1 tablet (500 mg total) by mouth 3 (three) times daily.   metoprolol tartrate 25 MG tablet Commonly known as: LOPRESSOR Take 1 tablet (25 mg total) by mouth 2 (two) times daily.   multivitamin tablet Take 1 tablet by mouth daily.   pantoprazole 40 MG tablet Commonly known as: PROTONIX TAKE 1 TABLET(40 MG) BY MOUTH DAILY   simvastatin 20 MG tablet Commonly known as: ZOCOR Take 20 mg by mouth daily at 6 PM.        OBJECTIVE:   Vital Signs: BP 132/82   Pulse 90   Ht 5' 8"  (1.727 m)   Wt 167 lb 4 oz (75.9 kg)   SpO2 98%   BMI 25.43 kg/m   Wt Readings from Last 3 Encounters:  04/10/20 167 lb 4 oz (75.9 kg)  04/04/20 167 lb 9.6 oz (76 kg)  03/10/20 166 lb 2 oz (75.4 kg)     Exam: General: Pt appears well and is in NAD  Lungs: Clear with good BS bilat with no rales, rhonchi, or wheezes  Heart: RRR with normal S1 and S2 and no gallops; no murmurs; no rub  Abdomen: Normoactive bowel sounds, soft, nontender, without masses or organomegaly palpable  Extremities: No pretibial edema.   Neuro: MS is good with appropriate affect, pt is alert and Ox3    DM foot exam: 08/03/2019 The skin of the feet is intact without sores or ulcerations. The pedal pulses are 2+ on right and 2+ on left. The sensation is intact to a screening 5.07, 10 gram monofilament bilaterally           DATA REVIEWED:  Lab Results  Component Value Date   HGBA1C 8.0 (A) 04/10/2020   HGBA1C 6.8 (A) 10/29/2019   HGBA1C 10.7 (H) 06/29/2019   Lab Results  Component Value Date   MICROALBUR 5.4 06/29/2019   [place]  11/02/2019     Comment:     . LDL cholesterol not calculated. Triglyceride levels greater than 400 mg/dL invalidate calculated LDL results. . Reference range:  <100 . Desirable range <100 mg/dL for primary prevention;   <70 mg/dL for patients with CHD or diabetic patients  with > or = 2 CHD risk factors. Marland Kitchen LDL-C is now calculated using the Martin-Hopkins  calculation, which is a validated novel method providing  better accuracy than the Friedewald equation in the  estimation of LDL-C.  Cresenciano Genre et al. Annamaria Helling. 1027;253(66): [phone]  (  http://education.[employer].com/faq/FAQ164)    CREATININE 0.65 03/10/2020   Lab Results  Component Value Date   MICRALBCREAT 22 06/29/2019            ASSESSMENT / PLAN / RECOMMENDATIONS:    1) Type 2 Diabetes Mellitus, Poorly controlled, Without complications - Most recent A1c of 8.0 %. Goal A1c < 7.0 %.    - Pt with hyperglycemia, he was unable to obtain Invokana and Lantus, something to do with his insurance coverage and increase in co-pay. This has been fixed and he is able to go back to his glycemic agents - GLP-1 agonists are contra-indicated due to hx of pancreatitis  - Will increase Invokana as below     MEDICATIONS:  -Restart  Lantus 14 units daily  - Continue Metformin 500 mg, three tablets daily  - Increase Invokana 300 mg daily     EDUCATION / INSTRUCTIONS:  BG monitoring instructions: Patient is instructed to check his blood sugars 1 times a day, fasting .  Call [facility] if: BG persistently < 70 . I reviewed the Rule of 15 for the treatment of hypoglycemia in detail with the patient. Literature supplied.    2) Diabetic complications:   Eye: Does not have known diabetic retinopathy.   Neuro/ Feet: Does not have known diabetic peripheral neuropathy .   Renal: Patient does not have known baseline CKD. He   is not on an ACEI/ARB at present.   3) Tachycardia/ Hyperhidrosis:  - TFT's have been normal.The diagnosis of autonomic neuropathy has been entertained - He is under the care of cardiology for further investigation of tachycardia    4)  Dyslipidemia:    - Tg as high as 413 mg/dL in the past, discussed increased risk of pancreatitis. He is on statin therapy.  - He is being followed by cardiology       F/U in 4 months    Signed electronically by: Mack Guise, MD  [facility]  [address] [place], [place] [address] Phone: [phone] FAX: [phone]   CC: [place], Betty G, [address] [place] [address] Phone: [phone]  Fax: [phone]  Return to Endocrinology clinic as below: Future Appointments  Date Time Provider [place]  04/18/2020  1:15 PM MC-SCREENING MC-SDSC None  04/22/2020  1:15 PM LBCT-CT 1 LBCT-CT LB-CT [facility]  04/22/2020  1:30 PM MC-CV [facility]  04/28/2020  7:15 AM MC-CV CH ECHO 3 MC-SITE3ECHO [facility]  04/30/2020  8:15 AM Josue Hector, MD CVD-[facility]USTOFF [facility]  03/11/2021  7:00 AM [place], Betty G, MD [facility]-BF P[facility]

## 2020-04-11 ENCOUNTER — Encounter: Payer: Self-pay | Admitting: Internal Medicine

## 2020-04-18 ENCOUNTER — Other Ambulatory Visit (HOSPITAL_COMMUNITY)
Admission: RE | Admit: 2020-04-18 | Discharge: 2020-04-18 | Disposition: A | Discharge status: Home / Self Care | Payer: 59 | Source: Ambulatory Visit | Attending: Cardiovascular Disease | Admitting: Cardiovascular Disease

## 2020-04-18 DIAGNOSIS — Z20822 Contact with and (suspected) exposure to covid-19: Secondary | ICD-10-CM | POA: Diagnosis not present

## 2020-04-18 DIAGNOSIS — Z01812 Encounter for preprocedural laboratory examination: Secondary | ICD-10-CM | POA: Insufficient documentation

## 2020-04-19 LAB — SARS CORONAVIRUS 2 (TAT 6-24 HRS): SARS Coronavirus 2: NEGATIVE

## 2020-04-22 ENCOUNTER — Other Ambulatory Visit: Payer: Self-pay

## 2020-04-22 ENCOUNTER — Ambulatory Visit (INDEPENDENT_AMBULATORY_CARE_PROVIDER_SITE_OTHER)
Admission: RE | Admit: 2020-04-22 | Discharge: 2020-04-22 | Disposition: A | Discharge status: Home / Self Care | Payer: Self-pay | Source: Ambulatory Visit | Attending: Cardiovascular Disease | Admitting: Cardiovascular Disease

## 2020-04-22 ENCOUNTER — Ambulatory Visit (INDEPENDENT_AMBULATORY_CARE_PROVIDER_SITE_OTHER): Payer: 59

## 2020-04-22 DIAGNOSIS — R000 Tachycardia, unspecified: Secondary | ICD-10-CM | POA: Diagnosis not present

## 2020-04-22 DIAGNOSIS — E785 Hyperlipidemia, unspecified: Secondary | ICD-10-CM | POA: Diagnosis not present

## 2020-04-22 LAB — EXERCISE TOLERANCE TEST
Estimated workload: 10.1 METS
Exercise duration (min): 8 min
Exercise duration (sec): 41 s
MPHR: 175 {beats}/min
Peak HR: 164 {beats}/min
Percent HR: 93 %
RPE: 17
Rest HR: 87 {beats}/min

## 2020-04-23 NOTE — Progress Notes (Signed)
CARDIOLOGY CONSULT NOTE       Patient ID: Rodney Pennington MRN: [id number] DOB/AGE: 1975-06-13 45 y.o.  Referring Physician: [place] Primary Physician: [place]ique, Betty G, MD Primary Cardiologist: Johnsie Cancel Reason for Consultation: Tachycardia  Active Problems:   * No active hospital problems. *   HPI:  45 y.o. referred by Dr Martinique 04/04/20  for tachycardia . History of DM, HLD, GERD He has noted HR in 90's at Rest for past few months up to 100's activity and 120's with exercise No associated symptoms of dyspnea Chest pain palpitations or syncope Did not notice improvement with Cardizem His TSH/T4 were normal 03/10/20 Hct 44.4 no anemia He is on statin for lipids and Lovaza added although DM not well controlled Some compliance issues Now on Lantus, metformin and Invokana A1c 8   He notes HR consistently higher last 6 months and slow to return down after activity No excess caffeine or stimulants Does not drink and does not seem anxious   Calcium score 0 04/22/20  ETT normal 04/22/20 HR 87-164 bpm Echo:  EF 50-55% 3D 54% no valve disease  Started on lopressor at that time and calcium blocker d/c   Feels better on lopressor Still seems overly concerned about heart  ROS All other systems reviewed and negative except as noted above  Past Medical History:  Diagnosis Date  . Chronic kidney disease   . Diabetes mellitus without complication ([place])   . GERD (gastroesophageal reflux disease)   . History of chicken pox   . Hx of eating disorder   . Hyperlipidemia     Family History  Problem Relation Age of Onset  . Arthritis Mother   . Heart attack Mother   . Hyperlipidemia Mother   . Kidney disease Mother   . Arthritis Father   . Arthritis Sister   . Hypertension Paternal Uncle   . Hearing loss Sister   . Hypertension Sister   . Kidney disease Sister   . Diabetes Brother     Social History   Socioeconomic History  . Marital status: Married    Spouse name: Not on file  .  Number of children: Not on file  . Years of education: Not on file  . Highest education level: Not on file  Occupational History  . Not on file  Tobacco Use  . Smoking status: Never Smoker  . Smokeless tobacco: Never Used  Vaping Use  . Vaping Use: Never used  Substance and Sexual Activity  . Alcohol use: No  . Drug use: Never  . Sexual activity: Yes  Other Topics Concern  . Not on file  Social History Narrative  . Not on file   Social Determinants of Health   Financial Resource Strain: Not on file  Food Insecurity: Not on file  Transportation Needs: Not on file  Physical Activity: Not on file  Stress: Not on file  Social Connections: Not on file  Intimate Partner Violence: Not on file    Past Surgical History:  Procedure Laterality Date  . ERCP  2017   calcular cholecystitis      Current Outpatient Medications:  .  Accu-Chek Softclix Lancets lancets, Use to test blood sugar 3 times daily., Disp: 100 each, Rfl: 12 .  atorvastatin (LIPITOR) 20 MG tablet, Take 20 mg by mouth daily., Disp: , Rfl:  .  blood glucose meter kit and supplies, Use to test blood sugar 3 times daily., Disp: 1 each, Rfl: 0 .  Blood Glucose Monitoring Suppl (  ACCU-CHEK GUIDE ME) w/Device KIT, Use to test blood sugar 3 times daily, Disp: 1 kit, Rfl: 0 .  canagliflozin (INVOKANA) 300 MG TABS tablet, Take 1 tablet (300 mg total) by mouth daily before breakfast., Disp: 30 tablet, Rfl: 6 .  glucose blood (ACCU-CHEK GUIDE) test strip, Use to test blood sugar 3 times daily., Disp: 100 each, Rfl: 12 .  insulin glargine (LANTUS SOLOSTAR) 100 UNIT/ML Solostar Pen, Inject 14 Units into the skin daily., Disp: 15 mL, Rfl: 6 .  Insulin Pen Needle 32G X 4 MM MISC, 1 Device by Does not apply route daily., Disp: 50 each, Rfl: 11 .  metFORMIN (GLUCOPHAGE) 500 MG tablet, Take 1 tablet (500 mg total) by mouth 3 (three) times daily., Disp: 270 tablet, Rfl: 3 .  metoprolol tartrate (LOPRESSOR) 25 MG tablet, Take 1 tablet  (25 mg total) by mouth 2 (two) times daily., Disp: 180 tablet, Rfl: 3 .  Multiple Vitamin (MULTIVITAMIN) tablet, Take 1 tablet by mouth daily., Disp: , Rfl:  .  pantoprazole (PROTONIX) 40 MG tablet, TAKE 1 TABLET(40 MG) BY MOUTH DAILY, Disp: 90 tablet, Rfl: 1 .  simvastatin (ZOCOR) 20 MG tablet, Take 20 mg by mouth daily at 6 PM., Disp: , Rfl:     Physical Exam: Blood pressure 130/72, pulse 87, height 5' 8"  (1.727 m), weight 77.1 kg, SpO2 99 %.    Affect appropriate Healthy:  appears stated age 75: normal Neck supple with no adenopathy JVP normal no bruits no thyromegaly Lungs clear with no wheezing and good diaphragmatic motion Heart:  S1/S2 no murmur, no rub, gallop or click PMI normal Abdomen: benighn, BS positve, no tenderness, no AAA no bruit.  No HSM or HJR Distal pulses intact with no bruits No edema Neuro non-focal Skin warm and dry No muscular weakness   Labs:   Lab Results  Component Value Date   WBC 5.2 03/10/2020   HGB 15.7 03/10/2020   HCT 46.4 03/10/2020   MCV 86.3 03/10/2020   PLT 205.0 03/10/2020   No results for input(s): NA, K, CL, CO2, BUN, CREATININE, CALCIUM, PROT, BILITOT, ALKPHOS, ALT, AST, GLUCOSE in the last 168 hours.  Invalid input(s): LABALBU No results found for: CKTOTAL, CKMB, CKMBINDEX, TROPONINI  Lab Results  Component Value Date   CHOL 183 03/10/2020   CHOL 169 11/02/2019   CHOL 238 (H) 06/29/2019   Lab Results  Component Value Date   HDL 45.10 03/10/2020   HDL 41 11/02/2019   HDL 49 06/29/2019   Lab Results  Component Value Date   LDLCALC  11/02/2019     Comment:     . LDL cholesterol not calculated. Triglyceride levels greater than 400 mg/dL invalidate calculated LDL results. . Reference range: <100 . Desirable range <100 mg/dL for primary prevention;   <70 mg/dL for patients with CHD or diabetic patients  with > or = 2 CHD risk factors. Marland Kitchen LDL-C is now calculated using the Martin-Hopkins  calculation, which is a  validated novel method providing  better accuracy than the Friedewald equation in the  estimation of LDL-C.  Cresenciano Genre et al. Annamaria Helling. 1610;960(45): 2061-2068  (http://education.[employer].com/faq/FAQ164)    LDLCALC 152 (H) 06/29/2019   Lab Results  Component Value Date   TRIG 304.0 (H) 03/10/2020   TRIG 205 (H) 11/27/2019   TRIG 413 (H) 11/02/2019   Lab Results  Component Value Date   CHOLHDL 4 03/10/2020   CHOLHDL 4.1 11/02/2019   CHOLHDL 4.9 06/29/2019   Lab Results  Component  Value Date   LDLDIRECT 98.0 03/10/2020      Radiology: CT CARDIAC SCORING (SELF PAY ONLY)  Addendum Date: 04/22/2020   ADDENDUM REPORT: 04/22/2020 14:06 EXAM: OVER-READ INTERPRETATION  CT CHEST The following report is an over-read performed by radiologist Dr. Rebekah Chesterfield [facility], [place] on 04/22/2020. This over-read does not include interpretation of cardiac or coronary anatomy or pathology. The coronary calcium score interpretation by the cardiologist is attached. COMPARISON:  None. FINDINGS: Within the visualized portions of the thorax there are no suspicious appearing pulmonary nodules or masses, there is no acute consolidative airspace disease, no pleural effusions, no pneumothorax and no lymphadenopathy. Visualized portions of the upper abdomen are unremarkable. There are no aggressive appearing lytic or blastic lesions noted in the visualized portions of the skeleton. IMPRESSION: 1. No significant incidental noncardiac findings are noted. Electronically Signed   By: Vinnie Langton M.D.   On: 04/22/2020 14:06   Result Date: 04/22/2020 CLINICAL DATA:  Risk stratification EXAM: Coronary Calcium Score TECHNIQUE: The patient was scanned on a Siemens Somatom 64 slice scanner. Axial non-contrast 75m slices were carried out through the heart. The data set was analyzed on a dedicated work station and scored using the [place] FINDINGS: Non-cardiac: No significant non cardiac findings on  limited lung and soft tissue windows. [facility] Ascending Aorta: Normal diameter 3.0 cm Pericardium: Normal Coronary arteries: No calcium noted IMPRESSION: Coronary calcium score of 0. PJenkins RougeElectronically Signed: By: PJenkins RougeM.D. On: 04/22/2020 13:33   ECHOCARDIOGRAM COMPLETE  Result Date: 04/28/2020    ECHOCARDIOGRAM REPORT   [facility]   [facility] Date of Exam: 04/28/2020 Medical Rec #:  [id number]            Height:       68.0 in Accession #:    [id number]           Weight:       167.2 lb Date of Birth:  [birth year]             BSA:          1.894 m Patient Age:    89years              BP:           118/62 mmHg Patient Gender: M                     HR:           73 bpm. Exam [facility]  [facility]: 2D Echo, 3D Echo, Cardiac Doppler and Color Doppler Indications:    R00.0 Tachycardia  History:        Patient has no prior history of Echocardiogram examinations.                 Arrythmias:Tachycardia; Risk Factors:Dyslipidemia.  Sonographer:    WCoralyn HellingRDCS Referring Phys: 5[place] 1. Left ventricular ejection fraction, by estimation, is 50 to 55%. Left ventricular ejection fraction by 3D volume is 54 %. The left ventricle has low normal function. The left ventricle has no regional wall motion abnormalities. Left ventricular diastolic parameters were normal.  2. Right ventricular systolic function is normal. The right ventricular size is normal. Tricuspid regurgitation signal is inadequate for assessing PA pressure.  3. The mitral valve is normal in structure. Trivial mitral valve regurgitation. No evidence of mitral stenosis.  4. The aortic  valve is normal in structure. Aortic valve regurgitation is not visualized. No aortic stenosis is present.  5. The inferior vena cava is normal in size with greater than 50% respiratory variability, suggesting right atrial pressure of 3 mmHg. FINDINGS  Left  Ventricle: Left ventricular ejection fraction, by estimation, is 50 to 55%. Left ventricular ejection fraction by 3D volume is 54 %. The left ventricle has low normal function. The left ventricle has no regional wall motion abnormalities. The left ventricular internal cavity size was normal in size. There is no left ventricular hypertrophy. Left ventricular diastolic parameters were normal. Normal left ventricular filling pressure. Right Ventricle: The right ventricular size is normal. No increase in right ventricular wall thickness. Right ventricular systolic function is normal. Tricuspid regurgitation signal is inadequate for assessing PA pressure. Left Atrium: Left atrial size was normal in size. Right Atrium: Right atrial size was normal in size. Pericardium: There is no evidence of pericardial effusion. Mitral Valve: The mitral valve is normal in structure. Trivial mitral valve regurgitation. No evidence of mitral valve stenosis. Tricuspid Valve: The tricuspid valve is normal in structure. Tricuspid valve regurgitation is trivial. No evidence of tricuspid stenosis. Aortic Valve: The aortic valve is normal in structure. Aortic valve regurgitation is not visualized. No aortic stenosis is present. Pulmonic Valve: The pulmonic valve was normal in structure. Pulmonic valve regurgitation is not visualized. No evidence of pulmonic stenosis. Aorta: The aortic root is normal in size and structure. Venous: The inferior vena cava is normal in size with greater than 50% respiratory variability, suggesting right atrial pressure of 3 mmHg. IAS/Shunts: No atrial level shunt detected by color flow Doppler.  LEFT VENTRICLE PLAX 2D LVIDd:         4.90 cm         Diastology LVIDs:         3.60 cm         LV e' medial:    6.85 cm/s LV PW:         1.20 cm         LV E/e' medial:  12.0 LV IVS:        1.00 cm         LV e' lateral:   11.40 cm/s LVOT diam:     2.20 cm         LV E/e' lateral: 7.2 LV SV:         57 LV SV Index:   30  LVOT Area:     3.80 cm        3D Volume EF                                LV 3D EF:    Left                                             ventricular                                             ejection  fraction by                                             3D volume                                             is 54 %.                                 3D Volume EF:                                3D EF:        54 % RIGHT VENTRICLE             IVC RV S prime:     10.40 cm/s  IVC diam: 1.20 cm TAPSE (M-mode): 1.7 cm LEFT ATRIUM             Index       RIGHT ATRIUM           Index LA diam:        3.95 cm 2.09 cm/m  RA Pressure: 3.00 mmHg LA Vol (A2C):   45.5 ml 24.02 ml/m RA Area:     11.80 cm LA Vol (A4C):   41.0 ml 21.65 ml/m RA Volume:   26.40 ml  13.94 ml/m LA Biplane Vol: 46.0 ml 24.29 ml/m  AORTIC VALVE LVOT Vmax:   73.20 cm/s LVOT Vmean:  48.100 cm/s LVOT VTI:    0.149 m  AORTA Ao Root diam: 3.40 cm Ao Asc diam:  3.00 cm MV E velocity: 81.90 cm/s  TRICUSPID VALVE MV A velocity: 65.60 cm/s  Estimated RAP:  3.00 mmHg MV E/A ratio:  1.25                            SHUNTS                            Systemic VTI:  0.15 m                            Systemic Diam: 2.20 cm Fransico Him MD Electronically signed by Fransico Him MD Signature Date/Time: 04/28/2020/8:43:47 AM    Final    EXERCISE TOLERANCE TEST (ETT)  Result Date: 04/22/2020  Blood pressure demonstrated a hypertensive response to exercise.  No T wave inversion was noted during stress.  There was no ST segment deviation noted during stress.  Blood pressure demonstrated a hypertensive response to exercise  Overall, the patient's exercise capacity was normal.  Negative stress test without evidence of ischemia at given workload. Hypertensive response to exercise.   EKG: NSR rate 76 normal 11/27/19    ASSESSMENT AND PLAN:   1. Tachycardia:  Seems functional no physical signs of pathology Labs ok  Baseline ECG normal , ETT Calcium score echo normal observe on beta blocker f/u echo in a year 2. DM:  Discussed low carb diet.  Target hemoglobin A1c is 6.5 or less.  Continue  current medications. 3. HLD:  On statin tighter sugar control and weight loss advised 4. GERD:  On protonix f/u GI  5. CAD:  Risk family history DM continue statin calcium score 0 04/22/20 and normal ETT     F/U in a year with echo same day   Signed: Jenkins Rouge 04/30/2020, 8:24 AM

## 2020-04-28 ENCOUNTER — Ambulatory Visit (HOSPITAL_COMMUNITY): Payer: 59 | Attending: Cardiology

## 2020-04-28 ENCOUNTER — Other Ambulatory Visit: Payer: Self-pay

## 2020-04-28 DIAGNOSIS — R000 Tachycardia, unspecified: Secondary | ICD-10-CM

## 2020-04-28 DIAGNOSIS — E785 Hyperlipidemia, unspecified: Secondary | ICD-10-CM | POA: Diagnosis not present

## 2020-04-28 LAB — ECHOCARDIOGRAM COMPLETE
Area-P 1/2: 5.02 cm2
S' Lateral: 3.6 cm

## 2020-04-30 ENCOUNTER — Other Ambulatory Visit: Payer: Self-pay

## 2020-04-30 ENCOUNTER — Encounter: Payer: Self-pay | Admitting: Cardiovascular Disease

## 2020-04-30 ENCOUNTER — Ambulatory Visit: Payer: 59 | Admitting: Cardiovascular Disease

## 2020-04-30 VITALS — BP 130/72 | HR 87 | Ht 68.0 in | Wt 170.0 lb

## 2020-04-30 DIAGNOSIS — R931 Abnormal findings on diagnostic imaging of heart and coronary circulation: Secondary | ICD-10-CM

## 2020-04-30 DIAGNOSIS — R9430 Abnormal result of cardiovascular function study, unspecified: Secondary | ICD-10-CM

## 2020-04-30 DIAGNOSIS — R000 Tachycardia, unspecified: Secondary | ICD-10-CM

## 2020-04-30 NOTE — Patient Instructions (Addendum)
Medication Instructions:  *If you need a refill on your cardiac medications before your next appointment, please call your pharmacy*   Lab Work: If you have labs (blood work) drawn today and your tests are completely normal, you will receive your results only by: Marland Kitchen MyChart Message (if you have MyChart) OR . A paper copy in the mail If you have any lab test that is abnormal or we need to change your treatment, we will call you to review the results.   Testing/Procedures: Your physician has requested that you have an echocardiogram in 1 year. Echocardiography is a painless test that uses sound waves to create images of your heart. It provides your doctor with information about the size and shape of your heart and how well your heart's chambers and valves are working. This procedure takes approximately one hour. There are no restrictions for this procedure.   Follow-Up: At [facility], you and your health needs are our priority.  As part of our continuing mission to provide you with exceptional heart care, we have created designated Provider Care Teams.  These Care Teams include your primary Cardiologist (physician) and Advanced Practice Providers (APPs -  Physician Assistants and Nurse Practitioners) who all work together to provide you with the care you need, when you need it.  We recommend signing up for the patient portal called "MyChart".  Sign up information is provided on this After Visit Summary.  MyChart is used to connect with patients for Virtual Visits (Telemedicine).  Patients are able to view lab/test results, encounter notes, upcoming appointments, etc.  Non-urgent messages can be sent to your provider as well.   To learn more about what you can do with MyChart, go to [email or web address].    Your next appointment:   12 month(s)  The format for your next appointment:   In Person  Provider:   You may see Dr. Eden Emms or one of the following Advanced Practice Providers on  your designated Care Team:    Georgie Chard, NP

## 2020-07-10 ENCOUNTER — Encounter: Payer: 59 | Admitting: Gastroenterology

## 2020-08-13 ENCOUNTER — Ambulatory Visit: Payer: 59 | Admitting: Internal Medicine

## 2020-08-13 NOTE — Progress Notes (Deleted)
Name: Rodney Pennington  Age/ Sex: 45 y.o., male   MRN/ DOB: [id number], [birth year]     PCP: [place], Betty G, MD   Reason for Endocrinology Evaluation: Type 2 Diabetes Mellitus  Initial Endocrine Consultative Visit: 08/03/2019    PATIENT IDENTIFIER: Rodney Pennington is a 45 y.o. male with a past medical history of  T2DM, CKD, Hx of pancreatitis due to gallstones (S/P ERCP) , renal stones , fatty liver  and dyslipidemia .Marland Kitchen The patient has followed with Endocrinology clinic since 08/03/2019 for consultative assistance with management of his diabetes.  DIABETIC HISTORY:  Rodney Pennington was diagnosed with DM in 2013. He was on Metformin 750 mg ER but he believes this wasn't absorbed so switched to 500 mg tabs and doing well. Januvia switched to Trulicity in 0/3474,QV was also on  Dimacron .   His hemoglobin A1c has ranged from 8.0% , peaking at 10.7% %  in 2021    Pt chronic GI issues in the form of cramps, pain and occasional regurgitation     On his initial visit to our clinic his A1c was 10.7% , he was on metformin, trulicity and Metformin , and no changes were made as he was recently started on this regimen.   trulicity was discontinued due to recurrent history of pancreatitis by 07/2019  SUBJECTIVE:   During the last visit (04/10/2020): A1c 8.0 % we continued metformin, restarted Lantus and increased INvokana   Today (08/13/2020): Rodney Pennington is here for a follow up on diabetes management.  He checks his blood sugars occasionally . The patient has not had hypoglycemic episodes since the last clinic visit.   GI issues have been improving  He has been evaluated by Cardiology for tachycardia    Has been evaluated by cardiology for palpitations currently on B-blockers , otherwise cardiac work up negative  [place]:  - Lantus 14 units daily - Metformin 500 mg, 1 tablet with Breakfast and 2 tablet with supper  - Invokana 300 mg daily      Statin:  Yes ACE-I/ARB: no    METER DOWNLOAD SUMMARY: Did not bring     DIABETIC COMPLICATIONS: Microvascular complications:   Denies: neuropathy, retinopathy and CKD Last Eye Exam: Completed 2021  Macrovascular complications:   Denies: CAD, CVA, PVD   HISTORY:  Past Medical History:  Past Medical History:  Diagnosis Date   Chronic kidney disease    Diabetes mellitus without complication ([place])    GERD (gastroesophageal reflux disease)    History of chicken pox    Hx of eating disorder    Hyperlipidemia    Past Surgical History:  Past Surgical History:  Procedure Laterality Date   ERCP  2017   calcular cholecystitis   Social History:  reports that he has never smoked. He has never used smokeless tobacco. He reports that he does not drink alcohol and does not use drugs. Family History:  Family History  Problem Relation Age of Onset   Arthritis Mother    Heart attack Mother    Hyperlipidemia Mother    Kidney disease Mother    Arthritis Father    Arthritis Sister    Hypertension Paternal Uncle    Hearing loss Sister    Hypertension Sister    Kidney disease Sister    Diabetes Brother      HOME MEDICATIONS: Allergies as of 08/13/2020   No Known Allergies      Medication List  Accurate as of August 13, 2020  7:12 AM. If you have any questions, ask your nurse or doctor.          Accu-Chek Guide Me w/Device Kit Use to test blood sugar 3 times daily   Accu-Chek Guide test strip Generic drug: glucose blood Use to test blood sugar 3 times daily.   Accu-Chek Softclix Lancets lancets Use to test blood sugar 3 times daily.   atorvastatin 20 MG tablet Commonly known as: LIPITOR Take 20 mg by mouth daily.   blood glucose meter kit and supplies Use to test blood sugar 3 times daily.   canagliflozin 300 MG Tabs tablet Commonly known as: Invokana Take 1 tablet (300 mg total) by mouth daily before breakfast.   Insulin Pen Needle 32G X 4 MM Misc 1  Device by Does not apply route daily.   Lantus SoloStar 100 UNIT/ML Solostar Pen Generic drug: insulin glargine Inject 14 Units into the skin daily.   metFORMIN 500 MG tablet Commonly known as: GLUCOPHAGE Take 1 tablet (500 mg total) by mouth 3 (three) times daily.   metoprolol tartrate 25 MG tablet Commonly known as: LOPRESSOR Take 1 tablet (25 mg total) by mouth 2 (two) times daily.   multivitamin tablet Take 1 tablet by mouth daily.   pantoprazole 40 MG tablet Commonly known as: PROTONIX TAKE 1 TABLET(40 MG) BY MOUTH DAILY   simvastatin 20 MG tablet Commonly known as: ZOCOR Take 20 mg by mouth daily at 6 PM.         OBJECTIVE:   Vital Signs: There were no vitals taken for this visit.  Wt Readings from Last 3 Encounters:  04/30/20 170 lb (77.1 kg)  04/10/20 167 lb 4 oz (75.9 kg)  04/04/20 167 lb 9.6 oz (76 kg)     Exam: General: Pt appears well and is in NAD  Lungs: Clear with good BS bilat with no rales, rhonchi, or wheezes  Heart: RRR with normal S1 and S2 and no gallops; no murmurs; no rub  Abdomen: Normoactive bowel sounds, soft, nontender, without masses or organomegaly palpable  Extremities: No pretibial edema.   Neuro: MS is good with appropriate affect, pt is alert and Ox3    DM foot exam: 08/03/2019 The skin of the feet is intact without sores or ulcerations. The pedal pulses are 2+ on right and 2+ on left. The sensation is intact to a screening 5.07, 10 gram monofilament bilaterally           DATA REVIEWED:  Lab Results  Component Value Date   HGBA1C 8.0 (A) 04/10/2020   HGBA1C 6.8 (A) 10/29/2019   HGBA1C 10.7 (H) 06/29/2019   Lab Results  Component Value Date   MICROALBUR 5.4 06/29/2019   [place]  11/02/2019     Comment:     . LDL cholesterol not calculated. Triglyceride levels greater than 400 mg/dL invalidate calculated LDL results. . Reference range: <100 . Desirable range <100 mg/dL for primary prevention;   <70 mg/dL for  patients with CHD or diabetic patients  with > or = 2 CHD risk factors. Marland Kitchen LDL-C is now calculated using the Martin-Hopkins  calculation, which is a validated novel method providing  better accuracy than the Friedewald equation in the  estimation of LDL-C.  Cresenciano Genre et al. Annamaria Helling. 5035;465(68): 2061-2068  (http://education.[employer].com/faq/FAQ164)    CREATININE 0.65 03/10/2020   Lab Results  Component Value Date   MICRALBCREAT 22 06/29/2019  ASSESSMENT / PLAN / RECOMMENDATIONS:     1) Type 2 Diabetes Mellitus, Poorly controlled, Without complications - Most recent A1c of 8.0 %. Goal A1c < 7.0 %.    - Pt with hyperglycemia, he was unable to obtain Invokana and Lantus, something to do with his insurance coverage and increase in co-pay. This has been fixed and he is able to go back to his glycemic agents - GLP-1 agonists are contra-indicated due to hx of pancreatitis  - Will increase Invokana as below     MEDICATIONS:  -Restart  Lantus 14 units daily  - Continue Metformin 500 mg, three tablets daily  - Increase Invokana 300 mg daily     EDUCATION / INSTRUCTIONS: BG monitoring instructions: Patient is instructed to check his blood sugars 1 times a day, fasting . Call [facility] if: BG persistently < 70 I reviewed the Rule of 15 for the treatment of hypoglycemia in detail with the patient. Literature supplied.    2) Diabetic complications:  Eye: Does not have known diabetic retinopathy.  Neuro/ Feet: Does not have known diabetic peripheral neuropathy .  Renal: Patient does not have known baseline CKD. He   is not on an ACEI/ARB at present.   3) Tachycardia/ Hyperhidrosis:  - TFT's have been normal.The diagnosis of autonomic neuropathy has been entertained - He is under the care of cardiology for further investigation of tachycardia    4) Dyslipidemia:    - Tg as high as 413 mg/dL in the past, discussed increased risk of  pancreatitis. He is on statin therapy.  - He is being followed by cardiology       F/U in 4 months    Signed electronically by: Mack Guise, MD  [facility] Endocrinology  [facility] [address] [place], [place] [address] Phone: [phone] FAX: [phone]   CC: [place], Betty G, [place] [address] Phone: [phone]  Fax: [phone]  Return to Endocrinology clinic as below: Future Appointments  Date Time Provider [place]  08/13/2020  7:50 AM Dmauri Rosenow, Melanie Crazier, MD [facility] None  03/11/2021  7:00 AM [place], Betty G, MD [facility]

## 2020-08-15 ENCOUNTER — Other Ambulatory Visit: Payer: Self-pay

## 2020-08-15 ENCOUNTER — Encounter: Payer: Self-pay | Admitting: Internal Medicine

## 2020-08-15 ENCOUNTER — Ambulatory Visit (INDEPENDENT_AMBULATORY_CARE_PROVIDER_SITE_OTHER): Payer: 59 | Admitting: Internal Medicine

## 2020-08-15 VITALS — BP 136/80 | HR 103 | Ht 68.0 in | Wt 167.0 lb

## 2020-08-15 DIAGNOSIS — E1169 Type 2 diabetes mellitus with other specified complication: Secondary | ICD-10-CM | POA: Diagnosis not present

## 2020-08-15 LAB — POCT GLYCOSYLATED HEMOGLOBIN (HGB A1C): Hemoglobin A1C: 10.2 % — AB (ref 4.0–5.6)

## 2020-08-15 MED ORDER — DAPAGLIFLOZIN PROPANEDIOL 10 MG PO TABS: 10.0000 mg | ORAL_TABLET | Freq: Every day | ORAL | 6 refills | Status: DC

## 2020-08-15 NOTE — Patient Instructions (Signed)
-    Lantus ( insulin ) 14 units daily  - Continue Metformin 500 mg, three tablets daily  - Start Farxiga 10 mg, 1 tablet daily      HOW TO TREAT LOW BLOOD SUGARS (Blood sugar LESS THAN 70 MG/DL) Please follow the RULE OF 15 for the treatment of hypoglycemia treatment (when your (blood sugars are less than 70 mg/dL)   STEP 1: Take 15 grams of carbohydrates when your blood sugar is low, which includes:  3-4 GLUCOSE TABS  OR 3-4 OZ OF JUICE OR REGULAR SODA OR ONE TUBE OF GLUCOSE GEL    STEP 2: RECHECK blood sugar in 15 MINUTES STEP 3: If your blood sugar is still low at the 15 minute recheck --> then, go back to STEP 1 and treat AGAIN with another 15 grams of carbohydrates.

## 2020-08-15 NOTE — Progress Notes (Signed)
Name: Rodney Pennington  Age/ Sex: 45 y.o., male   MRN/ DOB: [id number], 09/02/1975     PCP: Jordan, Betty G, MD   Reason for Endocrinology Evaluation: Type 2 Diabetes Mellitus  Initial Endocrine Consultative Visit: 08/03/2019    PATIENT IDENTIFIER: Rodney Pennington is a 45 y.o. male with a past medical history of  T2DM, CKD, Hx of pancreatitis due to gallstones (S/P ERCP) , renal stones , fatty liver  and dyslipidemia .. The patient has followed with Endocrinology clinic since 08/03/2019 for consultative assistance with management of his diabetes.  DIABETIC HISTORY:  Rodney Pennington was diagnosed with DM in 2013. He was on Metformin 750 mg ER but he believes this wasn't absorbed so switched to 500 mg tabs and doing well. Januvia switched to Trulicity in 05/2019,he was also on  Dimacron .   His hemoglobin A1c has ranged from 8.0% , peaking at 10.7% %  in 2021    Pt chronic GI issues in the form of cramps, pain and occasional regurgitation     On his initial visit to our clinic his A1c was 10.7% , he was on metformin, trulicity and Metformin , and no changes were made as he was recently started on this regimen.   trulicity was discontinued due to recurrent history of pancreatitis by 07/2019  SUBJECTIVE:   During the last visit (04/10/2020): A1c 8.0 % we continued metformin, restarted Lantus and increased INvokana   Today (08/15/2020): Rodney Pennington is here for a follow up on diabetes management.  He checks his blood sugars occasionally . The patient has not had hypoglycemic episodes since the last clinic visit.   GI issues have been improving  He has been evaluated by Cardiology for tachycardia    Has been evaluated by cardiology for palpitations currently on B-blockers , otherwise cardiac work up negative   Has been out of Invokana for 3 months , and lantus 2 weeks    HOME DIABETES REGIMEN:  - Lantus 14 units daily - Metformin 500 mg, 2 tablet with lunch  and 1 tablet with  supper  - Invokana 300 mg daily      Statin: Yes ACE-I/ARB: no    METER DOWNLOAD SUMMARY: Did not bring     DIABETIC COMPLICATIONS: Microvascular complications:   Denies: neuropathy, retinopathy and CKD Last Eye Exam: Completed 2021  Macrovascular complications:   Denies: CAD, CVA, PVD   HISTORY:  Past Medical History:  Past Medical History:  Diagnosis Date   Chronic kidney disease    Diabetes mellitus without complication ([facility])    GERD (gastroesophageal reflux disease)    History of chicken pox    Hx of eating disorder    Hyperlipidemia    Past Surgical History:  Past Surgical History:  Procedure Laterality Date   ERCP  2017   calcular cholecystitis   Social History:  reports that he has never smoked. He has never used smokeless tobacco. He reports that he does not drink alcohol and does not use drugs. Family History:  Family History  Problem Relation Age of Onset   Arthritis Mother    Heart attack Mother    Hyperlipidemia Mother    Kidney disease Mother    Arthritis Father    Arthritis Sister    Hypertension Paternal Uncle    Hearing loss Sister    Hypertension Sister    Kidney disease Sister    Diabetes Brother      HOME MEDICATIONS: Allergies as of 08/15/2020     No Known Allergies      Medication List        Accurate as of August 15, 2020 12:39 PM. If you have any questions, ask your nurse or doctor.          Accu-Chek Guide Me w/Device Kit Use to test blood sugar 3 times daily   Accu-Chek Guide test strip Generic drug: glucose blood Use to test blood sugar 3 times daily.   Accu-Chek Softclix Lancets lancets Use to test blood sugar 3 times daily.   atorvastatin 20 MG tablet Commonly known as: LIPITOR Take 20 mg by mouth daily.   blood glucose meter kit and supplies Use to test blood sugar 3 times daily.   canagliflozin 300 MG Tabs tablet Commonly known as: Invokana Take 1 tablet (300 mg total) by mouth daily before  breakfast.   Insulin Pen Needle 32G X 4 MM Misc 1 Device by Does not apply route daily.   Lantus SoloStar 100 UNIT/ML Solostar Pen Generic drug: insulin glargine Inject 14 Units into the skin daily.   metFORMIN 500 MG tablet Commonly known as: GLUCOPHAGE Take 1 tablet (500 mg total) by mouth 3 (three) times daily.   metoprolol tartrate 25 MG tablet Commonly known as: LOPRESSOR Take 1 tablet (25 mg total) by mouth 2 (two) times daily.   multivitamin tablet Take 1 tablet by mouth daily.   pantoprazole 40 MG tablet Commonly known as: PROTONIX TAKE 1 TABLET(40 MG) BY MOUTH DAILY   simvastatin 20 MG tablet Commonly known as: ZOCOR Take 20 mg by mouth daily at 6 PM.         OBJECTIVE:   Vital Signs: BP 136/80   Pulse (!) 103   Ht 5' 8" (1.727 m)   Wt 167 lb (75.8 kg)   SpO2 97%   BMI 25.39 kg/m    Wt Readings from Last 3 Encounters:  04/30/20 170 lb (77.1 kg)  04/10/20 167 lb 4 oz (75.9 kg)  04/04/20 167 lb 9.6 oz (76 kg)     Exam: General: Pt appears well and is in NAD  Lungs: Clear with good BS bilat with no rales, rhonchi, or wheezes  Heart: RRR   Abdomen: Normoactive bowel sounds, soft, nontender, without masses or organomegaly palpable  Extremities: No pretibial edema.   Neuro: MS is good with appropriate affect, pt is alert and Ox3    DM foot exam: 08/03/2019 The skin of the feet is intact without sores or ulcerations. The pedal pulses are 2+ on right and 2+ on left. The sensation is intact to a screening 5.07, 10 gram monofilament bilaterally           DATA REVIEWED:  Lab Results  Component Value Date   HGBA1C 8.0 (A) 04/10/2020   HGBA1C 6.8 (A) 10/29/2019   HGBA1C 10.7 (H) 06/29/2019   Lab Results  Component Value Date   MICROALBUR 5.4 06/29/2019   [place]  11/02/2019     Comment:     . LDL cholesterol not calculated. Triglyceride levels greater than 400 mg/dL invalidate calculated LDL results. . Reference range:  <100 . Desirable range <100 mg/dL for primary prevention;   <70 mg/dL for patients with CHD or diabetic patients  with > or = 2 CHD risk factors. Marland Kitchen LDL-C is now calculated using the Martin-Hopkins  calculation, which is a validated novel method providing  better accuracy than the Friedewald equation in the  estimation of LDL-C.  Cresenciano Genre et al. Annamaria Helling. 6195;093(26): 2061-2068  (http://education.[facility].com/faq/FAQ164)  CREATININE 0.65 03/10/2020   Lab Results  Component Value Date   MICRALBCREAT 22 06/29/2019            ASSESSMENT / PLAN / RECOMMENDATIONS:     1) Type 2 Diabetes Mellitus, Poorly controlled, Without complications - Most recent A1c of 10.2%. Goal A1c < 7.0 %.     -Poorly controlled diabetes due to medication nonadherence, Invokana and Lantus have been cost prohibitive and has been taking the metformin on a regular basis.  -Unfortunately he does not qualify for patient assistance program at this time -I am going to switch Invokana to [place], to see if this would be on the formulary, patient advised to contact her insurance company and request formulary from them -He was provided with [place]o samples, we also discussed [facility] [facility]PH if necessary  - GLP-1 agonists are contra-indicated due to hx of pancreatitis    MEDICATIONS:  -Restart  Lantus 14 units daily  - Continue Metformin 500 mg, three tablets daily  -Start Farxiga 10 mg daily    EDUCATION / INSTRUCTIONS: BG monitoring instructions: Patient is instructed to check his blood sugars 1 times a day, fasting . Call [facility] if: BG persistently < 70 I reviewed the Rule of 15 for the treatment of hypoglycemia in detail with the patient. Literature supplied.    2) Diabetic complications:  Eye: Does not have known diabetic retinopathy.  Neuro/ Feet: Does not have known diabetic peripheral neuropathy .  Renal: Patient does not have known baseline CKD. He   is not on an  ACEI/ARB at present.    F/U in 4 months    Signed electronically by: Mack Guise, MD  [facility] Endocrinology  [facility] [address] [place], [place] [address] Phone: [phone] FAX: [phone]   CC: [place], Betty G, [place] [address] Phone: [phone]  Fax: [phone]  Return to Endocrinology clinic as below: Future Appointments  Date Time Provider [place]  08/15/2020  2:20 PM Thijs Brunton, Melanie Crazier, MD [facility] None  03/11/2021  7:00 AM [place], Betty G, MD [facility]

## 2020-12-18 ENCOUNTER — Ambulatory Visit: Payer: 59 | Admitting: Internal Medicine

## 2020-12-19 ENCOUNTER — Encounter: Payer: Self-pay | Admitting: Internal Medicine

## 2020-12-19 ENCOUNTER — Other Ambulatory Visit: Payer: Self-pay

## 2020-12-19 ENCOUNTER — Ambulatory Visit (INDEPENDENT_AMBULATORY_CARE_PROVIDER_SITE_OTHER): Payer: 59 | Admitting: Internal Medicine

## 2020-12-19 VITALS — BP 140/80 | HR 100 | Ht 68.0 in | Wt 163.0 lb

## 2020-12-19 DIAGNOSIS — E1169 Type 2 diabetes mellitus with other specified complication: Secondary | ICD-10-CM

## 2020-12-19 DIAGNOSIS — Z794 Long term (current) use of insulin: Secondary | ICD-10-CM

## 2020-12-19 DIAGNOSIS — E785 Hyperlipidemia, unspecified: Secondary | ICD-10-CM | POA: Diagnosis not present

## 2020-12-19 DIAGNOSIS — E1165 Type 2 diabetes mellitus with hyperglycemia: Secondary | ICD-10-CM | POA: Diagnosis not present

## 2020-12-19 DIAGNOSIS — K219 Gastro-esophageal reflux disease without esophagitis: Secondary | ICD-10-CM

## 2020-12-19 LAB — BASIC METABOLIC PANEL
BUN: 10 mg/dL (ref 6–23)
CO2: 25 mEq/L (ref 19–32)
Calcium: 9.6 mg/dL (ref 8.4–10.5)
Chloride: 97 mEq/L (ref 96–112)
Creatinine, Ser: 0.77 mg/dL (ref 0.40–1.50)
GFR: 107.9 mL/min (ref >60.00)
Glucose, Bld: 411 mg/dL — ABNORMAL HIGH (ref 70–99)
Potassium: 4 mEq/L (ref 3.5–5.1)
Sodium: 134 mEq/L — ABNORMAL LOW (ref 135–145)

## 2020-12-19 LAB — POCT GLYCOSYLATED HEMOGLOBIN (HGB A1C): Hemoglobin A1C: 13.6 % — AB (ref 4.0–5.6)

## 2020-12-19 LAB — LIPID PANEL
Cholesterol: 244 mg/dL — ABNORMAL HIGH (ref 0–200)
HDL: 49.2 mg/dL (ref >39.00)
Total CHOL/HDL Ratio: 5
Triglycerides: 484 mg/dL — ABNORMAL HIGH (ref 0.0–149.0)

## 2020-12-19 LAB — GLUCOSE, POCT (MANUAL RESULT ENTRY): POC Glucose: 509 mg/dl — AB (ref 70–99)

## 2020-12-19 LAB — MICROALBUMIN / CREATININE URINE RATIO
Creatinine,U: 24.6 mg/dL
Microalb Creat Ratio: 9.8 mg/g (ref 0.0–30.0)
Microalb, Ur: 2.4 mg/dL — ABNORMAL HIGH (ref 0.0–1.9)

## 2020-12-19 LAB — LDL CHOLESTEROL, DIRECT: Direct LDL: 97 mg/dL

## 2020-12-19 MED ORDER — METFORMIN HCL 500 MG PO TABS: 500.0000 mg | ORAL_TABLET | Freq: Three times a day (TID) | ORAL | 3 refills | Status: DC

## 2020-12-19 MED ORDER — NOVOLIN R FLEXPEN 100 UNIT/ML IJ SOPN: 4.0000 [IU] | PEN_INJECTOR | Freq: Three times a day (TID) | INTRAMUSCULAR | 6 refills | Status: DC

## 2020-12-19 MED ORDER — PANTOPRAZOLE SODIUM 40 MG PO TBEC: 40.0000 mg | DELAYED_RELEASE_TABLET | Freq: Every day | ORAL | 1 refills | Status: DC

## 2020-12-19 MED ORDER — DAPAGLIFLOZIN PROPANEDIOL 10 MG PO TABS: 10.0000 mg | ORAL_TABLET | Freq: Every day | ORAL | 3 refills | Status: DC

## 2020-12-19 MED ORDER — INSULIN PEN NEEDLE 32G X 4 MM MISC: 1.0000 | Freq: Four times a day (QID) | 3 refills | Status: DC

## 2020-12-19 MED ORDER — LANTUS SOLOSTAR 100 UNIT/ML ~~LOC~~ SOPN: 20.0000 [IU] | PEN_INJECTOR | Freq: Every day | SUBCUTANEOUS | 6 refills | Status: DC

## 2020-12-19 MED ORDER — DAPAGLIFLOZIN PROPANEDIOL 10 MG PO TABS: 10.0000 mg | ORAL_TABLET | Freq: Every day | ORAL | 6 refills | Status: DC

## 2020-12-19 MED ORDER — ATORVASTATIN CALCIUM 40 MG PO TABS: 40.0000 mg | ORAL_TABLET | Freq: Every day | ORAL | 3 refills | Status: DC

## 2020-12-19 NOTE — Progress Notes (Signed)
Name: Rodney Pennington  Age/ Sex: 45 y.o., male   MRN/ DOB: [id number], 04-01-1975     PCP: [place], Betty G, MD   Reason for Endocrinology Evaluation: Type 2 Diabetes Mellitus  Initial Endocrine Consultative Visit: 08/03/2019    PATIENT IDENTIFIER: [name] is a 45 y.o. male with a past medical history of  T2DM, CKD, Hx of pancreatitis due to gallstones (S/P ERCP) , renal stones , fatty liver  and dyslipidemia .Marland Kitchen The patient has followed with Endocrinology clinic since 08/03/2019 for consultative assistance with management of his diabetes.  DIABETIC HISTORY:  [name] was diagnosed with DM in 2013. He was on Metformin 750 mg ER but he believes this wasn't absorbed so switched to 500 mg tabs and doing well. Januvia switched to Trulicity in 08/1243,YK was also on  Dimacron .   His hemoglobin A1c has ranged from 8.0% , peaking at 10.7% %  in 2021   Pt chronic GI issues in the form of cramps, pain and occasional regurgitation     On his initial visit to our clinic his A1c was 10.7% , he was on metformin, trulicity and Metformin , and no changes were made as he was recently started on this regimen.   trulicity was discontinued due to recurrent history of pancreatitis by 07/2019  SUBJECTIVE:   During the last visit (08/12/2020): A1c 10.2 % we continued metformin, restarted Lantus and Farxiga       Today (12/19/2020): [name] is here for a follow up on diabetes management.  He has not been checking his glucose .   He has not had Metformin in 2 months He has not received farxiga, states his insurance does not cover  He has not received Lantus from the pharmacy either   Pt states his GI issues has resolved, has not had a PPI  in 6 months, but asking a refill    He has been evaluated by Cardiology for tachycardia     HOME DIABETES REGIMEN:  - Lantus 14 units daily-not taking - Metformin 500 mg, 2 tablet with lunch  and 1 tablet with supper -not taking -  Farxiga 10 mg daily -not taking     Statin: Yes ACE-I/ARB: no    METER DOWNLOAD SUMMARY: Did not bring     DIABETIC COMPLICATIONS: Microvascular complications:   Denies: neuropathy, retinopathy and CKD Last Eye Exam: Completed 2021  Macrovascular complications:   Denies: CAD, CVA, PVD   HISTORY:  Past Medical History:  Past Medical History:  Diagnosis Date   Chronic kidney disease    Diabetes mellitus without complication ([place])    GERD (gastroesophageal reflux disease)    History of chicken pox    Hx of eating disorder    Hyperlipidemia    Past Surgical History:  Past Surgical History:  Procedure Laterality Date   ERCP  2017   calcular cholecystitis   Social History:  reports that he has never smoked. He has never used smokeless tobacco. He reports that he does not drink alcohol and does not use drugs. Family History:  Family History  Problem Relation Age of Onset   Arthritis Mother    Heart attack Mother    Hyperlipidemia Mother    Kidney disease Mother    Arthritis Father    Arthritis Sister    Hypertension Paternal Uncle    Hearing loss Sister    Hypertension Sister    Kidney disease Sister    Diabetes Brother  HOME MEDICATIONS: Allergies as of 12/19/2020   No Known Allergies      Medication List        Accurate as of December 19, 2020  4:23 PM. If you have any questions, ask your nurse or doctor.          STOP taking these medications    simvastatin 20 MG tablet Commonly known as: ZOCOR Stopped by: Dorita Sciara, MD       TAKE these medications    Accu-Chek Guide Me w/Device Kit Use to test blood sugar 3 times daily   Accu-Chek Guide test strip Generic drug: glucose blood Use to test blood sugar 3 times daily.   Accu-Chek Softclix Lancets lancets Use to test blood sugar 3 times daily.   atorvastatin 40 MG tablet Commonly known as: LIPITOR Take 1 tablet (40 mg total) by mouth daily. What changed:   medication strength how much to take Changed by: Dorita Sciara, MD   blood glucose meter kit and supplies Use to test blood sugar 3 times daily.   dapagliflozin propanediol 10 MG Tabs tablet Commonly known as: Farxiga Take 1 tablet (10 mg total) by mouth daily before breakfast.   Insulin Pen Needle 32G X 4 MM Misc 1 Device by Does not apply route in the morning, at noon, in the evening, and at bedtime. What changed: when to take this Changed by: Dorita Sciara, MD   Lantus SoloStar 100 UNIT/ML Solostar Pen Generic drug: insulin glargine Inject 20 Units into the skin daily. What changed: how much to take Changed by: Dorita Sciara, MD   metFORMIN 500 MG tablet Commonly known as: GLUCOPHAGE Take 1 tablet (500 mg total) by mouth 3 (three) times daily.   metoprolol tartrate 25 MG tablet Commonly known as: LOPRESSOR Take 1 tablet (25 mg total) by mouth 2 (two) times daily.   multivitamin tablet Take 1 tablet by mouth daily.   NovoLIN R FlexPen ReliOn 100 UNIT/ML KwikPen Generic drug: Insulin Regular Human Inject 4 Units as directed 3 (three) times daily before meals. Started by: Dorita Sciara, MD   pantoprazole 40 MG tablet Commonly known as: PROTONIX Take 1 tablet (40 mg total) by mouth daily. What changed: See the new instructions. Changed by: Dorita Sciara, MD         OBJECTIVE:   Vital Signs: BP 140/80 (BP Location: Left Arm, Patient Position: Sitting, Cuff Size: Small)   Pulse 100   Ht 5' 8"  (1.727 m)   Wt 163 lb (73.9 kg)   SpO2 97%   BMI 24.78 kg/m    Wt Readings from Last 3 Encounters:  12/19/20 163 lb (73.9 kg)  08/15/20 167 lb (75.8 kg)  04/30/20 170 lb (77.1 kg)     Exam: General: Pt appears well and is in NAD  Lungs: Clear with good BS bilat with no rales, rhonchi, or wheezes  Heart: RRR   Abdomen: Normoactive bowel sounds, soft, nontender, without masses or organomegaly palpable  Extremities: No  pretibial edema.   Neuro: MS is good with appropriate affect, pt is alert and Ox3    DM foot exam: 08/03/2019 The skin of the feet is intact without sores or ulcerations. The pedal pulses are 2+ on right and 2+ on left. The sensation is intact to a screening 5.07, 10 gram monofilament bilaterally           DATA REVIEWED:  Lab Results  Component Value Date   HGBA1C 13.6 (A) 12/19/2020  HGBA1C 10.2 (A) 08/15/2020   HGBA1C 8.0 (A) 04/10/2020   Lab Results  Component Value Date   MICROALBUR 2.4 (H) 12/19/2020   [place]  11/02/2019     Comment:     . LDL cholesterol not calculated. Triglyceride levels greater than 400 mg/dL invalidate calculated LDL results. . Reference range: <100 . Desirable range <100 mg/dL for primary prevention;   <70 mg/dL for patients with CHD or diabetic patients  with > or = 2 CHD risk factors. Marland Kitchen LDL-C is now calculated using the Martin-Hopkins  calculation, which is a validated novel method providing  better accuracy than the Friedewald equation in the  estimation of LDL-C.  Cresenciano Genre et al. Annamaria Helling. 6945;038(88): 2061-2068  (http://education.[employer].com/faq/FAQ164)    CREATININE 0.77 12/19/2020   Lab Results  Component Value Date   MICRALBCREAT 9.8 12/19/2020            ASSESSMENT / PLAN / RECOMMENDATIONS:     1) Type 2 Diabetes Mellitus, Poorly controlled, Without complications - Most recent A1c of 13.6 %. Goal A1c < 7.0 %.     -Poorly controlled diabetes due to medication nonadherence, he has not taken any of his glycemic agents in months, patient attributes this to lack of response from the pharmacy, today he changed the branch of the pharmacy -I try to delve further into the reason of this as it appears that this is a trend where there is always a Miscommunication between him and the pharmacy, I even offered to send his prescriptions to [facility] and he may end up taking ReliOn insulin but at this time he would like to  continue with [facility] in the current regimen -His in office BG's 509 mg/DL patient ate a small little more effort and tea this a.m., I have recommended prandial insulin with reluctance the patient has accepted but he does admit that he will have an issue taking this on a regular basis -Patient would like for me to start him on Trulicity again, but I explained to him that I am not comfortable prescribing GLP-1 agonist to somebody who has had history of recurrent pancreatitis regardless of the cause of the pancreatitis at the time.  MEDICATIONS:  -Restart  Lantus at 20 units daily  -Restart metformin 500 mg, three tablets daily  -Restart Farxiga 10 mg daily -Start regular insulin 4 units with each meal    EDUCATION / INSTRUCTIONS: BG monitoring instructions: Patient is instructed to check his blood sugars 1 times a day, fasting . Call [place] [facility] if: BG persistently < 70 I reviewed the Rule of 15 for the treatment of hypoglycemia in detail with the patient. Literature supplied.    2) Diabetic complications:  Eye: Does not have known diabetic retinopathy.  Neuro/ Feet: Does not have known diabetic peripheral neuropathy .  Renal: Patient does not have known baseline CKD. He   is not on an ACEI/ARB at present.   3) Dyslipidemia:  -His TG is elevated to 484 mg/DL.  I will increase his atorvastatin and start him on fish oil  Medication Increase atorvastatin from 20 to 40 mg daily Start fish oil 1000 mg twice daily   F/U in 4 months    Signed electronically by: Mack Guise, MD  [facility]  [facility] [address], [place] [address] Phone: [phone] FAX: [phone]   CC: [place], Betty G, [address] [place] [address] Phone: [phone]  Fax: [phone]  Return to  Endocrinology clinic as below: Future Appointments  Date Time Provider [place]  03/11/2021  7:00  AM [place], Betty G, MD [facility]  03/27/2021  8:50 AM Karmelo Bass, Melanie Crazier, MD [facility] None

## 2020-12-19 NOTE — Patient Instructions (Signed)
-   Restart  Lantus at 20 units daily  - Restart Metformin 500 mg, three tablets daily  - Re-start Farxiga 10 mg, 1 tablet daily  - Start Regular insulin 4 units before each meal      HOW TO TREAT LOW BLOOD SUGARS (Blood sugar LESS THAN 70 MG/DL) Please follow the RULE OF 15 for the treatment of hypoglycemia treatment (when your (blood sugars are less than 70 mg/dL)   STEP 1: Take 15 grams of carbohydrates when your blood sugar is low, which includes:  3-4 GLUCOSE TABS  OR 3-4 OZ OF JUICE OR REGULAR SODA OR ONE TUBE OF GLUCOSE GEL    STEP 2: RECHECK blood sugar in 15 MINUTES STEP 3: If your blood sugar is still low at the 15 minute recheck --> then, go back to STEP 1 and treat AGAIN with another 15 grams of carbohydrates.

## 2021-03-11 ENCOUNTER — Encounter: Payer: 59 | Admitting: Family Medicine

## 2021-03-19 ENCOUNTER — Telehealth: Payer: Self-pay | Admitting: Family Medicine

## 2021-03-19 NOTE — Telephone Encounter (Signed)
Patient calling in with respiratory symptoms: Shortness of breath, chest pain, palpitations or other red words send to Triage  Does the patient have a fever over 100, cough, congestion, sore throat, runny nose, lost of taste/smell (please list symptoms that patient has) cough,runny nose,muscle pain, ha  What date did symptoms start?03-16-2021 (If over 5 days ago, pt may be scheduled for in person visit)  Have you tested for Covid in the last 5 days? Yes   If yes, was it positive [x]  OR negative [] ? If positive in the last 5 days, please schedule virtual visit now. If negative, schedule for an in person OV with the next available provider if PCP has no openings. Please also let patient know they will be tested again (follow the script below)  "you will have to arrive prior to your appt time to be Covid tested. Please park in back of office at the cone & call [phone] to let the staff know you have arrived. A staff member will meet you at your car to do a rapid covid test. Once the test has resulted you will be notified by phone of your results to determine if appt will remain an in person visit or be converted to a virtual/phone visit. If you arrive less than before your appt time, your visit will be automatically converted to virtual & any recommended testing will happen AFTER the visit." Pt has virtual with dr [phone] 03-20-2021 330 pm  THINGS TO REMEMBER  If no availability for virtual visit in office,  please schedule another [place] office  If no availability at another [place] office, please instruct patient that they can schedule an evisit or virtual visit through their mychart account. Visits up to 8pm  patients can be seen in office 5 days after positive COVID test

## 2021-03-20 ENCOUNTER — Encounter: Payer: Self-pay | Admitting: Family Medicine

## 2021-03-20 ENCOUNTER — Telehealth (INDEPENDENT_AMBULATORY_CARE_PROVIDER_SITE_OTHER): Payer: Self-pay | Admitting: Family Medicine

## 2021-03-20 ENCOUNTER — Other Ambulatory Visit: Payer: Self-pay | Admitting: Family Medicine

## 2021-03-20 VITALS — Ht 68.0 in

## 2021-03-20 DIAGNOSIS — U071 COVID-19: Secondary | ICD-10-CM

## 2021-03-20 DIAGNOSIS — R059 Cough, unspecified: Secondary | ICD-10-CM

## 2021-03-20 MED ORDER — BENZONATATE 100 MG PO CAPS: 200.0000 mg | ORAL_CAPSULE | Freq: Two times a day (BID) | ORAL | 0 refills | Status: AC | PRN

## 2021-03-20 MED ORDER — FLUTICASONE PROPIONATE 50 MCG/ACT NA SUSP: 1.0000 | Freq: Two times a day (BID) | NASAL | 0 refills | Status: DC

## 2021-03-20 NOTE — Progress Notes (Addendum)
Virtual Visit via Telephone Note I connected with Rodney Pennington on 03/20/21 at  3:30 PM EST by telephone and verified that I am speaking with the correct person using two identifiers. Caregility video connection was not possible, tried x 5 and it gave me "Error null."   I discussed the limitations, risks, security and privacy concerns of performing an evaluation and management service by telephone and the availability of in person appointments. I also discussed with the patient that there may be a patient responsible charge related to this service. The patient expressed understanding and agreed to proceed.  Location patient: home Location provider: work office Participants present for the call: patient, provider Patient did not have a visit in the prior 7 days to address this/these issue(s).  Chief Complaint  Patient presents with   Covid Positive   History of Present Illness: Rodney Pennington is a 46 y.o.male with hx of DM II and GERD c/o 6 days of respiratory symptoms. Initially he had clear rhinorrhea and 2 days later he started with fever,chills,body aches,fatigue, mild headache,sore throat, nasal congestion, and nonproductive cough. Negative for anosmia, ageusia, CP, wheezing, dyspnea, palpitations, abdominal pain, vomiting, changes in bowel habits, urinary symptoms, or skin rash. + Nausea. No known sick contact but a few days before symptoms started [place] and he thinks he may have been around a sick person. He is feeling much better. Most symptoms have resolved. Still mild cough and nasal congestion.  He has taken Tylenol.  He is also requesting CKD diagnosis to be removed from past medical history. I have not addressed this problem in the past. Creatinine and e GFR has been normal.  Observations/Objective: Patient sounds cheerful and well on the phone. I do not appreciate any SOB. Speech and thought processing are grossly intact. Patient reported  vitals:Ht 5\' 8"  (1.727 m)    BMI 24.78 kg/m   Assessment and Plan:  1. COVID-19 virus infection We discussed Dx,possible complications and treatment options. He has a mild to moderate case with some risk for complications but given the fact it has been 6 days, antiviral treatmetn is not indicated. He is feeling better.  Symptomatic treatment with plenty of fluids,rest,tylenol 500 mg 3-4 times per day prn. Flonase nasal spray daily as needed to help with nasal congestion and rhinorrhea. Recommend completing 7 days of quarantine, starting from symptoms onset. Clearly instructed about warning signs.  - fluticasone (FLONASE) 50 MCG/ACT nasal spray; Place 1 spray into both nostrils 2 (two) times daily.  Dispense: 16 g; Refill: 0  2. Cough, unspecified type Explained that cough and congestion may last a few more days and even weeks after acute symptoms have resolved. Benzonatate and adequate hydration for symptomatic treatment.  - benzonatate (TESSALON) 100 MG capsule; Take 2 capsules (200 mg total) by mouth 2 (two) times daily as needed for up to 10 days.  Dispense: 30 capsule; Refill: 0  CKD removed from PMHx.  Follow Up Instructions:  Return if symptoms worsen or fail to improve.  I did not refer this patient for an OV in the next 24 hours for this/these issue(s).  I discussed the assessment and treatment plan with the patient. The patient was provided an opportunity to ask questions and all were answered. The patient agreed with the plan and demonstrated an understanding of the instructions.   The patient was advised to call back or seek an in-person evaluation if the symptoms worsen or if the condition fails to improve  as anticipated.  I provided 16 minutes of non-face-to-face time during this encounter.  Kennie Snedden [place], MD

## 2021-03-23 ENCOUNTER — Encounter: Payer: Self-pay | Admitting: Family Medicine

## 2021-03-23 NOTE — Progress Notes (Signed)
HPI: [name] is a 46 y.o.male here today for his routine physical examination.  Last CPE: 03/10/20   Regular exercise 3 or more times per week: He has not been consistent for the past 2-3 months. Following a healthy diet: He is doing better.His wife cooks, he eats vegetables, low carb and low sugar diet.He eats fried fish 3 times per week.   Chronic medical problems: DM II,HLD,sinus tach,and GERD.  Immunization History  Administered Date(s) Administered   Janssen (J&J) SARS-COV-2 Vaccination 05/28/2019, 05/28/2019   Pneumococcal Polysaccharide-23 03/10/2020   Tdap 03/10/2020    Health Maintenance  Topic Date Due   OPHTHALMOLOGY EXAM  Never done   COLONOSCOPY (Pts 45-63yr Insurance coverage will need to be confirmed)  Never done   FOOT EXAM  06/28/2020   COVID-19 Vaccine (3 - Booster for Janssen series) 04/08/2021 (Originally 07/23/2019)   INFLUENZA VACCINE  05/29/2021 (Originally 09/29/2020)   HIV Screening  03/11/2023 (Originally 03/01/1990)   HEMOGLOBIN A1C  06/19/2021   URINE MICROALBUMIN  12/19/2021   TETANUS/TDAP  03/10/2030   Hepatitis C Screening  Completed   HPV VACCINES  Aged Out    -Negative for high alcohol intake or tobacco use.  -Concerns and/or follow up today:  For years he has had dandruff, improved with OTC medicated shampoo but it comes back . + Pruritus. He has not noted tender lesions. He is concerned about this being contagious.  HLD: He is on atorvastatin, increased from 20 to 40 mg a few months ago. He has not been consistent with following low-fat diet.  Lab Results  Component Value Date   CHOL 244 (H) 12/19/2020   HDL 49.20 12/19/2020   LDLCALC  11/02/2019     Comment:     . LDL cholesterol not calculated. Triglyceride levels greater than 400 mg/dL invalidate calculated LDL results. . Reference range: <100 . Desirable range <100 mg/dL for primary prevention;   <70 mg/dL for patients with CHD or diabetic patients  with >  or = 2 CHD risk factors. .Marland KitchenLDL-C is now calculated using the Martin-Hopkins  calculation, which is a validated novel method providing  better accuracy than the Friedewald equation in the  estimation of LDL-C.  MCresenciano Genreet al. JAnnamaria Helling 21779;390(30: 2061-2068  (http://education.[facility].com/faq/FAQ164)    LDLDIRECT 97.0 12/19/2020   TRIG (H) 12/19/2020    484.0 Triglyceride is over 400; calculations on Lipids are invalid.   CHOLHDL 5 12/19/2020   DM II: He follows with endocrinologist. Lab Results  Component Value Date   HGBA1C 13.6 (A) 12/19/2020   Recently seen for COVID 19 infection, 03/20/21. Still having mild cough. Nausea, requested medication to help with problem, Zofran has been sent.  Review of Systems  Constitutional:  Positive for fatigue. Negative for activity change, appetite change and fever.  HENT:  Positive for postnasal drip. Negative for dental problem, nosebleeds, sore throat and trouble swallowing.   Eyes:  Negative for redness and visual disturbance.  Respiratory:  Positive for cough. Negative for shortness of breath and wheezing.   Cardiovascular:  Negative for chest pain, palpitations and leg swelling.  Gastrointestinal:  Positive for nausea. Negative for abdominal pain, blood in stool and vomiting.  Endocrine: Negative for cold intolerance, heat intolerance, polydipsia, polyphagia and polyuria.  Genitourinary:  Negative for decreased urine volume, dysuria, genital sores, hematuria and testicular pain.  Musculoskeletal:  Negative for gait problem and myalgias.  Skin:  Negative for color change and rash.  Allergic/Immunologic: Negative for environmental allergies.  Neurological:  Negative for syncope, weakness and headaches.  Hematological:  Negative for adenopathy. Does not bruise/bleed easily.  Psychiatric/Behavioral:  Negative for confusion. The patient is not nervous/anxious.    Current Outpatient Medications on File Prior to Visit  Medication Sig  Dispense Refill   Accu-Chek Softclix Lancets lancets Use to test blood sugar 3 times daily. 100 each 12   atorvastatin (LIPITOR) 40 MG tablet Take 1 tablet (40 mg total) by mouth daily. 90 tablet 3   benzonatate (TESSALON) 100 MG capsule Take 2 capsules (200 mg total) by mouth 2 (two) times daily as needed for up to 10 days. 30 capsule 0   blood glucose meter kit and supplies Use to test blood sugar 3 times daily. 1 each 0   Blood Glucose Monitoring Suppl (ACCU-CHEK GUIDE ME) w/Device KIT Use to test blood sugar 3 times daily 1 kit 0   dapagliflozin propanediol (FARXIGA) 10 MG TABS tablet Take 1 tablet (10 mg total) by mouth daily before breakfast. 90 tablet 3   fluticasone (FLONASE) 50 MCG/ACT nasal spray SHAKE LIQUID AND USE 1 SPRAY IN EACH NOSTRIL TWICE DAILY 48 g 0   glucose blood (ACCU-CHEK GUIDE) test strip Use to test blood sugar 3 times daily. 100 each 12   insulin glargine (LANTUS SOLOSTAR) 100 UNIT/ML Solostar Pen Inject 20 Units into the skin daily. 15 mL 6   Insulin Pen Needle 32G X 4 MM MISC 1 Device by Does not apply route in the morning, at noon, in the evening, and at bedtime. 400 each 3   Insulin Regular Human (NOVOLIN R FLEXPEN RELION) 100 UNIT/ML KwikPen Inject 4 Units as directed 3 (three) times daily before meals. 15 mL 6   metFORMIN (GLUCOPHAGE) 500 MG tablet Take 1 tablet (500 mg total) by mouth 3 (three) times daily. 270 tablet 3   metoprolol tartrate (LOPRESSOR) 25 MG tablet Take 1 tablet (25 mg total) by mouth 2 (two) times daily. 180 tablet 3   Multiple Vitamin (MULTIVITAMIN) tablet Take 1 tablet by mouth daily.     pantoprazole (PROTONIX) 40 MG tablet Take 1 tablet (40 mg total) by mouth daily. 90 tablet 1   No current facility-administered medications on file prior to visit.   Past Medical History:  Diagnosis Date   Diabetes mellitus without complication (HCC)    GERD (gastroesophageal reflux disease)    History of chicken pox    Hx of eating disorder     Hyperlipidemia    Past Surgical History:  Procedure Laterality Date   ERCP  2017   calcular cholecystitis   No Known Allergies  Family History  Problem Relation Age of Onset   Arthritis Mother    Heart attack Mother    Hyperlipidemia Mother    Kidney disease Mother    Arthritis Father    Arthritis Sister    Hypertension Paternal Uncle    Hearing loss Sister    Hypertension Sister    Kidney disease Sister    Diabetes Brother     Social History   Socioeconomic History   Marital status: Married    Spouse name: Not on file   Number of children: Not on file   Years of education: Not on file   Highest education level: Not on file  Occupational History   Not on file  Tobacco Use   Smoking status: Never   Smokeless tobacco: Never  Vaping Use   Vaping Use: Never used  Substance and Sexual Activity   Alcohol  use: No   Drug use: Never   Sexual activity: Yes  Other Topics Concern   Not on file  Social History Narrative   Not on file   Social Determinants of Health   Financial Resource Strain: Not on file  Food Insecurity: Not on file  Transportation Needs: Not on file  Physical Activity: Not on file  Stress: Not on file  Social Connections: Not on file   Vitals:   03/24/21 0827  BP: 120/70  Pulse: 91  Resp: 16  SpO2: 99%   Body mass index is 26.21 kg/m.  Wt Readings from Last 3 Encounters:  03/24/21 172 lb 6 oz (78.2 kg)  12/19/20 163 lb (73.9 kg)  08/15/20 167 lb (75.8 kg)   Physical Exam Vitals and nursing note reviewed.  Constitutional:      General: He is not in acute distress.    Appearance: He is well-developed.  HENT:     Head: Normocephalic and atraumatic.     Right Ear: Tympanic membrane, ear canal and external ear normal.     Left Ear: Tympanic membrane, ear canal and external ear normal.     Mouth/Throat:     Mouth: Mucous membranes are moist.     Pharynx: Oropharynx is clear.     Comments: Postnasal drainage. Eyes:      Conjunctiva/sclera: Conjunctivae normal.     Pupils: Pupils are equal, round, and reactive to light.  Neck:     Thyroid: No thyromegaly.     Trachea: No tracheal deviation.  Cardiovascular:     Rate and Rhythm: Normal rate and regular rhythm.     Pulses:          Dorsalis pedis pulses are 2+ on the right side and 2+ on the left side.     Heart sounds: No murmur heard. Pulmonary:     Effort: Pulmonary effort is normal. No respiratory distress.     Breath sounds: Normal breath sounds.  Abdominal:     Palpations: Abdomen is soft. There is no hepatomegaly or mass.     Tenderness: There is no abdominal tenderness.  Genitourinary:    Comments: No concerns. Musculoskeletal:        General: No tenderness.     Cervical back: Normal range of motion.     Comments: No major deformities appreciated and no signs of synovitis.  Lymphadenopathy:     Cervical: No cervical adenopathy.     Upper Body:     Right upper body: No supraclavicular adenopathy.     Left upper body: No supraclavicular adenopathy.  Skin:    General: Skin is warm.     Findings: No erythema.     Comments: Scalp with scattered thick scally areas, no erythema.   Neurological:     Mental Status: He is alert and oriented to person, place, and time.     Cranial Nerves: No cranial nerve deficit.     Sensory: No sensory deficit.     Coordination: Coordination normal.     Gait: Gait normal.     Deep Tendon Reflexes:     Reflex Scores:      Bicep reflexes are 2+ on the right side and 2+ on the left side.      Patellar reflexes are 2+ on the right side and 2+ on the left side.  ASSESSMENT AND PLAN: Mr.Le was seen today for annual exam.  Diagnoses and all orders for this visit: Orders Placed This Encounter  Procedures  Lipid panel   Hepatic function panel   LDL cholesterol, direct   Ambulatory referral to Gastroenterology   Lab Results  Component Value Date   CHOL 206 (H) 03/24/2021   HDL 41.70 03/24/2021    [place]  11/02/2019     Comment:     . LDL cholesterol not calculated. Triglyceride levels greater than 400 mg/dL invalidate calculated LDL results. . Reference range: <100 . Desirable range <100 mg/dL for primary prevention;   <70 mg/dL for patients with CHD or diabetic patients  with > or = 2 CHD risk factors. Marland Kitchen LDL-C is now calculated using the Martin-Hopkins  calculation, which is a validated novel method providing  better accuracy than the Friedewald equation in the  estimation of LDL-C.  Cresenciano Genre et al. Annamaria Helling. 2706;237(62): 2061-2068  (http://education.[facility].com/faq/FAQ164)    LDLDIRECT 126.0 03/24/2021   TRIG 215.0 (H) 03/24/2021   CHOLHDL 5 03/24/2021   Lab Results  Component Value Date   ALT 25 03/24/2021   AST 23 03/24/2021   ALKPHOS 75 03/24/2021   BILITOT 0.7 03/24/2021   Routine general medical examination at a health care facility We discussed the importance of regular physical activity and healthy diet for prevention of chronic illness and/or complications. Preventive guidelines reviewed. Vaccination up to date. Refused flu vaccine.  Next CPE in a year.  Seborrheic dermatitis of scalp We discussed diagnosis, prognosis, and treatment options. We also reviewed differential diagnosis. Explained that this is a chronic finding, it can be recurrent. Recommend ketoconazole shampoo daily for a week and continue twice per week. Betamethasone on scalp twice daily for 14 days then as needed. If problem is not better controlled, we can consider dermatology referral.  -     ketoconazole (NIZORAL) 2 % shampoo; Apply 1 application topically 2 (two) times a week. -     Betamethasone Valerate 0.12 % foam; Apply 1 application topically 2 (two) times daily as needed.  Colon cancer screening -     Ambulatory referral to Gastroenterology  Type 2 diabetes mellitus with hyperglycemia, with long-term current use of insulin ([place]) HgA1C has not been at  goal. Continue following with endocrinologist.  Hyperlipidemia associated with type 2 diabetes mellitus ([place]) Continue atorvastatin 40 mg daily. Stressed the importance of following low fat diet, avoid fried foods. Further recommendation will be given according to lipid panel result.  Return in 1 year (on 03/24/2022) for CPE.  Cheetara Hoge G. [place], MD  [facility]. [place] office.

## 2021-03-24 ENCOUNTER — Encounter: Payer: Self-pay | Admitting: Family Medicine

## 2021-03-24 ENCOUNTER — Other Ambulatory Visit: Payer: Self-pay | Admitting: Family Medicine

## 2021-03-24 ENCOUNTER — Ambulatory Visit (INDEPENDENT_AMBULATORY_CARE_PROVIDER_SITE_OTHER): Payer: 59 | Admitting: Family Medicine

## 2021-03-24 VITALS — BP 120/70 | HR 91 | Resp 16 | Ht 68.0 in | Wt 172.4 lb

## 2021-03-24 DIAGNOSIS — E1165 Type 2 diabetes mellitus with hyperglycemia: Secondary | ICD-10-CM

## 2021-03-24 DIAGNOSIS — L219 Seborrheic dermatitis, unspecified: Secondary | ICD-10-CM | POA: Diagnosis not present

## 2021-03-24 DIAGNOSIS — E1169 Type 2 diabetes mellitus with other specified complication: Secondary | ICD-10-CM | POA: Diagnosis not present

## 2021-03-24 DIAGNOSIS — Z0000 Encounter for general adult medical examination without abnormal findings: Secondary | ICD-10-CM

## 2021-03-24 DIAGNOSIS — Z794 Long term (current) use of insulin: Secondary | ICD-10-CM

## 2021-03-24 DIAGNOSIS — Z1211 Encounter for screening for malignant neoplasm of colon: Secondary | ICD-10-CM | POA: Diagnosis not present

## 2021-03-24 DIAGNOSIS — E785 Hyperlipidemia, unspecified: Secondary | ICD-10-CM | POA: Diagnosis not present

## 2021-03-24 DIAGNOSIS — R110 Nausea: Secondary | ICD-10-CM

## 2021-03-24 LAB — HEPATIC FUNCTION PANEL
ALT: 25 U/L (ref 0–53)
AST: 23 U/L (ref 0–37)
Albumin: 4.3 g/dL (ref 3.5–5.2)
Alkaline Phosphatase: 75 U/L (ref 39–117)
Bilirubin, Direct: 0.1 mg/dL (ref 0.0–0.3)
Total Bilirubin: 0.7 mg/dL (ref 0.2–1.2)
Total Protein: 7.7 g/dL (ref 6.0–8.3)

## 2021-03-24 LAB — LIPID PANEL
Cholesterol: 206 mg/dL — ABNORMAL HIGH (ref 0–200)
HDL: 41.7 mg/dL (ref >39.00)
NonHDL: 164.27
Total CHOL/HDL Ratio: 5
Triglycerides: 215 mg/dL — ABNORMAL HIGH (ref 0.0–149.0)
VLDL: 43 mg/dL — ABNORMAL HIGH (ref 0.0–40.0)

## 2021-03-24 LAB — LDL CHOLESTEROL, DIRECT: Direct LDL: 126 mg/dL

## 2021-03-24 MED ORDER — KETOCONAZOLE 2 % EX SHAM: 1.0000 "application " | MEDICATED_SHAMPOO | CUTANEOUS | 3 refills | Status: DC

## 2021-03-24 MED ORDER — BETAMETHASONE VALERATE 0.12 % EX FOAM: 1.0000 "application " | Freq: Two times a day (BID) | CUTANEOUS | 3 refills | Status: DC | PRN

## 2021-03-24 MED ORDER — ONDANSETRON HCL 4 MG PO TABS: 4.0000 mg | ORAL_TABLET | Freq: Two times a day (BID) | ORAL | 0 refills | Status: AC | PRN

## 2021-03-24 NOTE — Assessment & Plan Note (Addendum)
HgA1C has not been at goal. Continue following with endocrinologist.

## 2021-03-24 NOTE — Patient Instructions (Addendum)
A few things to remember from today's visit:  Routine general medical examination at a health care facility  Hyperlipidemia associated with type 2 diabetes mellitus (HCC) - Plan: Lipid panel, Hepatic function panel  Seborrheic dermatitis of scalp - Plan: ketoconazole (NIZORAL) 2 % shampoo, Betamethasone Valerate 0.12 % foam  Colon cancer screening - Plan: Ambulatory referral to Gastroenterology  Use shampoo daily for a week rinse after 10-15 min. If not better we can consider dermatology referral.  Do not use My Chart to request refills or for acute issues that need immediate attention.   Please be sure medication list is accurate. If a new problem present, please set up appointment sooner than planned today.   At least 150 minutes of moderate exercise per week, daily brisk walking for 15-30 min is a good exercise option. Healthy diet low in saturated (animal) fats and sweets and consisting of fresh fruits and vegetables, lean meats such as fish and white chicken and whole grains.  - Vaccines:  Tdap vaccine every 10 years.  Shingles vaccine recommended at age 43, could be given after 46 years of age but not sure about insurance coverage.  Pneumonia vaccines: Pneumovax at 62  -Screening recommendations for low/normal risk males:  Screening for diabetes at age 59 and every 3 years. Earlier screening if cardiovascular risk factors.N/A  Lipid screening at 35 and every 3 years. Screening starts in younger males with cardiovascular risk factors.N/A  Colon cancer screening is now at age 50 but your insurance may not cover until age 55 .screening is recommended age 55.  Prostate cancer screening: some controversy, starts usually at 50: Rectal exam and PSA.  Aortic Abdominal Aneurism once between 52 and 26 years old if ever smoker.  Also recommended:  Dental visit- Brush and floss your teeth twice daily; visit your dentist twice a year. Eye doctor- Get an eye exam at least every 2  years. Helmet use- Always wear a helmet when riding a bicycle, motorcycle, rollerblading or skateboarding. Safe sex- If you may be exposed to sexually transmitted infections, use a condom. Seat belts- Seat belts can save your live; always wear one. Smoke/Carbon Monoxide detectors- These detectors need to be installed on the appropriate level of your home. Replace batteries at least once a year. Skin cancer- When out in the sun please cover up and use sunscreen 15 SPF or higher. Violence- If anyone is threatening or hurting you, please tell your healthcare provider.  Drink alcohol in moderation- Limit alcohol intake to one drink or less per day. Never drink and drive.       General tips Work with your doctor to lose weight if you need to. Avoid: Foods with added sugar. Fried foods. Foods with trans fat or partially hydrogenated oils. This includes some margarines and baked goods. If you drink alcohol: Limit how much you have to: 0-1 drink a day for women who are not pregnant. 0-2 drinks a day for men. Know how much alcohol is in a drink. In the [place] one drink equals one 12 oz bottle of beer (355 mL), one 5 oz glass of wine (148 mL), or one 1 oz glass of hard liquor (44 mL). Reading food labels Check food labels for: Trans fats. Partially hydrogenated oils. Saturated fat (g) in each serving. Cholesterol (mg) in each serving. Fiber (g) in each serving. Choose foods with healthy fats, such as: Monounsaturated fats and polyunsaturated fats. These include olive and canola oil, flaxseeds, walnuts, almonds, and seeds. Omega-3 fats. These  are found in certain fish, flaxseed oil, and ground flaxseeds. Choose grain products that have whole grains. Look for the word "whole" as the first word in the ingredient list. Cooking Cook foods using low-fat methods. These include baking, boiling, grilling, and broiling. Eat more home-cooked foods. Eat at restaurants and buffets less often. Eat less  fast food. Avoid cooking using saturated fats, such as butter, cream, palm oil, palm kernel oil, and coconut oil. Meal planning  At meals, divide your plate into four equal parts: Fill one-half of your plate with vegetables, green salads, and fruit. Fill one-fourth of your plate with whole grains. Fill one-fourth of your plate with low-fat (lean) protein foods. Eat fish that is high in omega-3 fats at least two times a week. This includes mackerel, tuna, sardines, and salmon. Eat foods that are high in fiber, such as whole grains, beans, apples, pears, berries, broccoli, carrots, peas, and barley. What foods should I eat? Fruits All fresh, canned (in natural juice), or frozen fruits. Vegetables Fresh or frozen vegetables (raw, steamed, roasted, or grilled). Green salads. Grains Whole grains, such as whole wheat or whole grain breads, crackers, cereals, and pasta. Unsweetened oatmeal, bulgur, barley, quinoa, or brown rice. Corn or whole wheat flour tortillas. Meats and other protein foods Ground beef (85% or leaner), grass-fed beef, or beef trimmed of fat. Skinless chicken or [place]. Ground chicken or [place]. Pork trimmed of fat. All fish and seafood. Egg whites. Dried beans, peas, or lentils. Unsalted nuts or seeds. Unsalted canned beans. Nut butters without added sugar or oil. Dairy Low-fat or nonfat dairy products, such as skim or 1% milk, 2% or reduced-fat cheeses, low-fat and fat-free ricotta or cottage cheese, or plain low-fat and nonfat yogurt. Fats and oils Tub margarine without trans fats. Light or reduced-fat mayonnaise and salad dressings. Avocado. Olive, canola, sesame, or safflower oils. The items listed above may not be a complete list of foods and beverages you can eat. Contact a dietitian for more information. What foods should I avoid? Fruits Canned fruit in heavy syrup. Fruit in cream or butter sauce. Fried fruit. Vegetables Vegetables cooked in cheese, cream, or butter  sauce. Fried vegetables. Grains White bread. White pasta. White rice. Cornbread. Bagels, pastries, and croissants. Crackers and snack foods that contain trans fat and hydrogenated oils. Meats and other protein foods Fatty cuts of meat. Ribs, chicken wings, bacon, sausage, bologna, salami, chitterlings, fatback, hot dogs, bratwurst, and packaged lunch meats. Liver and organ meats. Whole eggs and egg yolks. Chicken and [place] with skin. Fried meat. Dairy Whole or 2% milk, cream, half-and-half, and cream cheese. Whole milk cheeses. Whole-fat or sweetened yogurt. Full-fat cheeses. Nondairy creamers and whipped toppings. Processed cheese, cheese spreads, and cheese curds. Fats and oils Butter, stick margarine, lard, shortening, ghee, or bacon fat. Coconut, palm kernel, and palm oils. Beverages Alcohol. Sugar-sweetened drinks such as sodas, lemonade, and fruit drinks. Sweets and desserts Corn syrup, sugars, honey, and molasses. Candy. Jam and jelly. Syrup. Sweetened cereals. Cookies, pies, cakes, donuts, muffins, and ice cream. The items listed above may not be a complete list of foods and beverages you should avoid. Contact a dietitian for more information. Summary Choosing the right foods helps keep your fat and cholesterol at normal levels. This can keep you from getting certain diseases. At meals, fill one-half of your plate with vegetables, green salads, and fruits. Eat high fiber foods, like whole grains, beans, apples, pears, berries, carrots, peas, and barley. Limit added sugar, saturated fats, alcohol,  and fried foods. This information is not intended to replace advice given to you by your health care provider. Make sure you discuss any questions you have with your health care provider. Document Revised: 06/27/2020 Document Reviewed: 06/27/2020 Elsevier Patient Education  2022 [employer].

## 2021-03-24 NOTE — Assessment & Plan Note (Signed)
Continue atorvastatin 40 mg daily. Stressed the importance of following low fat diet, avoid fried foods. Further recommendation will be given according to lipid panel result.

## 2021-03-27 ENCOUNTER — Ambulatory Visit: Payer: 59 | Admitting: Internal Medicine

## 2021-04-27 ENCOUNTER — Other Ambulatory Visit: Payer: Self-pay

## 2021-04-27 ENCOUNTER — Ambulatory Visit (INDEPENDENT_AMBULATORY_CARE_PROVIDER_SITE_OTHER): Payer: 59 | Admitting: Internal Medicine

## 2021-04-27 ENCOUNTER — Encounter: Payer: Self-pay | Admitting: Internal Medicine

## 2021-04-27 VITALS — BP 120/80 | HR 91 | Ht 68.0 in | Wt 177.0 lb

## 2021-04-27 DIAGNOSIS — Z794 Long term (current) use of insulin: Secondary | ICD-10-CM

## 2021-04-27 DIAGNOSIS — E1169 Type 2 diabetes mellitus with other specified complication: Secondary | ICD-10-CM | POA: Diagnosis not present

## 2021-04-27 DIAGNOSIS — E785 Hyperlipidemia, unspecified: Secondary | ICD-10-CM | POA: Diagnosis not present

## 2021-04-27 DIAGNOSIS — E1165 Type 2 diabetes mellitus with hyperglycemia: Secondary | ICD-10-CM

## 2021-04-27 LAB — POCT GLYCOSYLATED HEMOGLOBIN (HGB A1C): Hemoglobin A1C: 8.9 % — AB (ref 4.0–5.6)

## 2021-04-27 LAB — POCT GLUCOSE (DEVICE FOR HOME USE): Glucose Fasting, POC: 176 mg/dL — AB (ref 70–99)

## 2021-04-27 MED ORDER — INSULIN GLARGINE-YFGN 100 UNIT/ML ~~LOC~~ SOLN: 20.0000 [IU] | Freq: Every day | SUBCUTANEOUS | 3 refills | Status: DC

## 2021-04-27 MED ORDER — METFORMIN HCL 500 MG PO TABS: 500.0000 mg | ORAL_TABLET | Freq: Three times a day (TID) | ORAL | 3 refills | Status: DC

## 2021-04-27 MED ORDER — NOVOLIN R FLEXPEN 100 UNIT/ML IJ SOPN: PEN_INJECTOR | INTRAMUSCULAR | 6 refills | Status: DC

## 2021-04-27 MED ORDER — DAPAGLIFLOZIN PROPANEDIOL 10 MG PO TABS: 10.0000 mg | ORAL_TABLET | Freq: Every day | ORAL | 3 refills | Status: DC

## 2021-04-27 MED ORDER — INSULIN PEN NEEDLE 32G X 4 MM MISC: 1.0000 | Freq: Four times a day (QID) | 3 refills | Status: DC

## 2021-04-27 NOTE — Progress Notes (Signed)
Name: Rodney Pennington  Age/ Sex: 46 y.o., male   MRN/ DOB: [id number], 21-Dec-1975     PCP: Martinique, Betty G, MD   Reason for Endocrinology Evaluation: Type 2 Diabetes Mellitus  Initial Endocrine Consultative Visit: 08/03/2019    PATIENT IDENTIFIER: [name] is a 46 y.o. male with a past medical history of  T2DM, CKD, Hx of pancreatitis due to gallstones (S/P ERCP) , renal stones , fatty liver  and dyslipidemia .Marland Kitchen The patient has followed with Endocrinology clinic since 08/03/2019 for consultative assistance with management of his diabetes.  DIABETIC HISTORY:  [name] was diagnosed with DM in 2013. He was on Metformin 750 mg ER but he believes this wasn't absorbed so switched to 500 mg tabs and doing well. Januvia switched to Trulicity in 08/7822,MP was also on  Dimacron .   His hemoglobin A1c has ranged from 8.0% , peaking at 10.7% %  in 2021   Pt chronic GI issues in the form of cramps, pain and occasional regurgitation     On his initial visit to our clinic his A1c was 10.7% , he was on metformin, trulicity and Metformin , and basal insulin, no changes were made as he was recently started on this regimen.   Trulicity was discontinued due to recurrent history of pancreatitis by 07/2019 Attempted to start SGLT-2 inhibitors in 2022 but recurrent history of medication interruption for variable reasons mainly pharmacy/ insurance issues  Started Regular insulin 11/2020 with an A1c 13.6%   SUBJECTIVE:   During the last visit (12/19/2020): A1c 13.6% restarted Lantus and Farxiga , metformin and started prandial insulin       Today (04/27/2021): Rodney Pennington is here for a follow up on diabetes management.  He has not been checking his glucose .   Has not been on Lantus in 2 months    Palpitations improving  Denies nausea, vomiting or diarrhea  Continues with hyperhidrosis-TFTs have been normal   HOME DIABETES REGIMEN:  - Lantus 20 units daily - Metformin 500  mg, 3 tabs daily  - Farxiga 10 mg daily - Novolin-R 4 units TIDQAC - takes twice day      Statin: Yes ACE-I/ARB: no    METER DOWNLOAD SUMMARY: Did not bring     DIABETIC COMPLICATIONS: Microvascular complications:   Denies: neuropathy, retinopathy and CKD Last Eye Exam: Completed 2021  Macrovascular complications:   Denies: CAD, CVA, PVD   HISTORY:  Past Medical History:  Past Medical History:  Diagnosis Date   Diabetes mellitus without complication ([place])    GERD (gastroesophageal reflux disease)    History of chicken pox    Hx of eating disorder    Hyperlipidemia    Past Surgical History:  Past Surgical History:  Procedure Laterality Date   ERCP  2017   calcular cholecystitis   Social History:  reports that he has never smoked. He has never used smokeless tobacco. He reports that he does not drink alcohol and does not use drugs. Family History:  Family History  Problem Relation Age of Onset   Arthritis Mother    Heart attack Mother    Hyperlipidemia Mother    Kidney disease Mother    Arthritis Father    Arthritis Sister    Hypertension Paternal Uncle    Hearing loss Sister    Hypertension Sister    Kidney disease Sister    Diabetes Brother      HOME MEDICATIONS: Allergies as of 04/27/2021   No  Known Allergies      Medication List        Accurate as of April 27, 2021  9:09 AM. If you have any questions, ask your nurse or doctor.          Accu-Chek Guide Me w/Device Kit Use to test blood sugar 3 times daily   Accu-Chek Guide test strip Generic drug: glucose blood Use to test blood sugar 3 times daily.   Accu-Chek Softclix Lancets lancets Use to test blood sugar 3 times daily.   atorvastatin 40 MG tablet Commonly known as: LIPITOR Take 1 tablet (40 mg total) by mouth daily.   Betamethasone Valerate 0.12 % foam Apply 1 application topically 2 (two) times daily as needed.   blood glucose meter kit and supplies Use to test  blood sugar 3 times daily.   dapagliflozin propanediol 10 MG Tabs tablet Commonly known as: Farxiga Take 1 tablet (10 mg total) by mouth daily before breakfast.   fluticasone 50 MCG/ACT nasal spray Commonly known as: FLONASE SHAKE LIQUID AND USE 1 SPRAY IN EACH NOSTRIL TWICE DAILY   insulin glargine-yfgn 100 UNIT/ML injection Commonly known as: Semglee (yfgn) Inject 0.2 mLs (20 Units total) into the skin daily. Started by: Dorita Sciara, MD   Insulin Pen Needle 32G X 4 MM Misc 1 Device by Does not apply route in the morning, at noon, in the evening, and at bedtime.   ketoconazole 2 % shampoo Commonly known as: Nizoral Apply 1 application topically 2 (two) times a week.   Lantus SoloStar 100 UNIT/ML Solostar Pen Generic drug: insulin glargine Inject 20 Units into the skin daily.   metFORMIN 500 MG tablet Commonly known as: GLUCOPHAGE Take 1 tablet (500 mg total) by mouth 3 (three) times daily.   metoprolol tartrate 25 MG tablet Commonly known as: LOPRESSOR Take 1 tablet (25 mg total) by mouth 2 (two) times daily.   multivitamin tablet Take 1 tablet by mouth daily.   NovoLIN R FlexPen ReliOn 100 UNIT/ML KwikPen Generic drug: Insulin Regular Human Inject 4 Units as directed 3 (three) times daily before meals.   pantoprazole 40 MG tablet Commonly known as: PROTONIX Take 1 tablet (40 mg total) by mouth daily.         OBJECTIVE:   Vital Signs: BP 120/80 (BP Location: Left Arm, Patient Position: Sitting, Cuff Size: Small)    Pulse 91    Ht 5' 8"  (1.727 m)    Wt 177 lb (80.3 kg)    SpO2 99%    BMI 26.91 kg/m    Wt Readings from Last 3 Encounters:  04/27/21 177 lb (80.3 kg)  03/24/21 172 lb 6 oz (78.2 kg)  12/19/20 163 lb (73.9 kg)     Exam: General: Pt appears well and is in NAD  Lungs: Clear with good BS bilat with no rales, rhonchi, or wheezes  Heart: RRR   Abdomen: Normoactive bowel sounds, soft, nontender, without masses or organomegaly palpable   Extremities: No pretibial edema.   Neuro: MS is good with appropriate affect, pt is alert and Ox3    DM foot exam: 04/27/2021 The skin of the feet is intact without sores or ulcerations. The pedal pulses are 2+ on right and 2+ on left. The sensation is intact to a screening 5.07, 10 gram monofilament bilaterally           DATA REVIEWED:  Lab Results  Component Value Date   HGBA1C 8.9 (A) 04/27/2021   HGBA1C 13.6 (A) 12/19/2020  HGBA1C 10.2 (A) 08/15/2020    Lab Results  Component Value Date   MICRALBCREAT 9.8 12/19/2020     Latest Reference Range & Units 12/19/20 09:46 03/24/21 09:11  Sodium 135 - 145 mEq/L 134 (L)   Potassium 3.5 - 5.1 mEq/L 4.0   Chloride 96 - 112 mEq/L 97   CO2 19 - 32 mEq/L 25   Glucose 70 - 99 mg/dL 411 (H)   BUN 6 - 23 mg/dL 10   Creatinine 0.40 - 1.50 mg/dL 0.77   Calcium 8.4 - 10.5 mg/dL 9.6   Alkaline Phosphatase 39 - 117 U/L  75  Albumin 3.5 - 5.2 g/dL  4.3  AST 0 - 37 U/L  23  ALT 0 - 53 U/L  25  Total Protein 6.0 - 8.3 g/dL  7.7  Bilirubin, Direct 0.0 - 0.3 mg/dL  0.1  Total Bilirubin 0.2 - 1.2 mg/dL  0.7  GFR >60.00 mL/min 107.90          Latest Reference Range & Units 03/24/21 09:11  Total CHOL/HDL Ratio  5  Cholesterol 0 - 200 mg/dL 206 (H)  HDL Cholesterol >39.00 mg/dL 41.70  Direct LDL mg/dL 126.0  NonHDL  164.27  Triglycerides 0.0 - 149.0 mg/dL 215.0 (H)  VLDL 0.0 - 40.0 mg/dL 43.0 (H)    Latest Reference Range & Units 12/19/20 09:46  Creatinine,U mg/dL 24.6  Microalb, Ur 0.0 - 1.9 mg/dL 2.4 (H)  MICROALB/CREAT RATIO 0.0 - 30.0 mg/g 9.8      ASSESSMENT / PLAN / RECOMMENDATIONS:     1) Type 2 Diabetes Mellitus, Poorly controlled, with improving glycemic control, Without complications - Most recent A1c of 8.9 %. Goal A1c < 7.0 %.    - A1c down from 13.6%  -I have praised the patient on improved glycemic control, he has never been on basal insulin.  Patient tells me his co-pay is $400, he is not sure why -I am  going to prescribe Semglee, if this is cost prohibitive we will proceed with NPH at bedtime -He was able to get the [place] months after initial prescription -He will also be  -Not a candidate for GLP-1 agonist nor DPP 4 inhibitors due to recurrent pancreatitis    MEDICATIONS:  -Start Semglee 20 units daily  -Continue  Metformin 500 mg, three tablets daily  -Continue Farxiga 10 mg daily -Continue Novolin-R units with each meal -Correction factor: Novolin-R (BG -130/35)   EDUCATION / INSTRUCTIONS: BG monitoring instructions: Patient is instructed to check his blood sugars 1 times a day, fasting . Call [facility] if: BG persistently < 70 I reviewed the Rule of 15 for the treatment of hypoglycemia in detail with the patient. Literature supplied.    2) Diabetic complications:  Eye: Does not have known diabetic retinopathy.  Neuro/ Feet: Does not have known diabetic peripheral neuropathy .  Renal: Patient does not have known baseline CKD. He   is not on an ACEI/ARB at present.   3) Dyslipidemia:  -His TG was  elevated to 484 mg/DL in 11/2020 . We increased Atorvastatin and started fish oil 11/2020 with repeat Tg at 215 mg/dL in 03/2021 -Praised the patient on improved lipid panel and continue current regimen  Medication continue  Atorvastatin 40 mg daily Fish oil  1800 mg  daily   F/U in 6 months    Signed electronically by: Mack Guise, MD  [facility]  [facility] [address], [place], [place] [address]  Phone: [phone] FAX: [phone]   CC: Martinique, Betty G, [place] [address] Phone: [phone]  Fax: [phone]  Return to Endocrinology clinic as below: Future Appointments  Date Time Provider [place]  04/28/2021  8:30 AM [facility] ECHO 4 [id number]  03/26/2022  8:30 AM Martinique, Betty G, MD [id number]

## 2021-04-27 NOTE — Patient Instructions (Addendum)
-   Start Semglee  20 units daily  - Continue  Metformin 500 mg, three tablets daily  - Continue Farxiga 10 mg, 1 tablet daily  - Novolin-R (Regular)  8 units before each meal  -Novolin-R (regular) Correction scale  : ADD extra units on insulin to your meal-time Novolin-R dose if your blood sugars are higher than 165. Use the scale below to help guide you:   Blood sugar before meal Number of units to inject  Less than 165 0 unit  166 -  200 1 units  201 -  235 2 units  236 -  270 3 units  271 -  305 4 units  306 -  340 5 units  341 -  375 6 units  376 - 410  7 units    If Semglee is not covered, let me know and will prescribe Novolon- N ( NPH) to be taken at bedtime    HOW TO TREAT LOW BLOOD SUGARS (Blood sugar LESS THAN 70 MG/DL) Please follow the RULE OF 15 for the treatment of hypoglycemia treatment (when your (blood sugars are less than 70 mg/dL)   STEP 1: Take 15 grams of carbohydrates when your blood sugar is low, which includes:  3-4 GLUCOSE TABS  OR 3-4 OZ OF JUICE OR REGULAR SODA OR ONE TUBE OF GLUCOSE GEL    STEP 2: RECHECK blood sugar in 15 MINUTES STEP 3: If your blood sugar is still low at the 15 minute recheck --> then, go back to STEP 1 and treat AGAIN with another 15 grams of carbohydrates.

## 2021-04-28 ENCOUNTER — Ambulatory Visit (HOSPITAL_COMMUNITY): Payer: 59 | Attending: Internal Medicine

## 2021-04-28 DIAGNOSIS — R9430 Abnormal result of cardiovascular function study, unspecified: Secondary | ICD-10-CM | POA: Insufficient documentation

## 2021-04-28 LAB — ECHOCARDIOGRAM COMPLETE
Area-P 1/2: 4.68 cm2
S' Lateral: 3.2 cm

## 2021-05-01 ENCOUNTER — Other Ambulatory Visit: Payer: Self-pay

## 2021-05-01 MED ORDER — METOPROLOL TARTRATE 25 MG PO TABS: 25.0000 mg | ORAL_TABLET | Freq: Two times a day (BID) | ORAL | 3 refills | Status: AC

## 2021-07-21 NOTE — Progress Notes (Incomplete)
CARDIOLOGY CONSULT NOTE       Patient ID: Rodney Pennington MRN: [id number] DOB/AGE: August 09, 1975 46 y.o.  Referring Physician: Martinique Primary Physician: Martinique, Betty G, MD Primary Cardiologist: Johnsie Cancel Reason for Consultation: Tachycardia  Active Problems:   * No active hospital problems. *   HPI:  46 y.o. referred by Dr Martinique 04/04/20  for tachycardia . History of DM, HLD, GERD He has noted HR in 90's at Rest for past few months up to 100's activity and 120's with exercise No associated symptoms of dyspnea Chest pain palpitations or syncope Did not notice improvement with Cardizem His TSH/T4 were normal 03/10/20 Hct 44.4 no anemia He is on statin for lipids and Lovaza added although DM not well controlled Some compliance issues Now on Lantus, metformin and Invokana A1c 8   He notes HR consistently higher last 6 months and slow to return down after activity No excess caffeine or stimulants Does not drink and does not seem anxious   Calcium score 0 04/22/20  ETT normal 04/22/20 HR 87-164 bpm Echo:  04/28/20  EF 50-55% 3D 54% no valve disease  Started on lopressor at that time and calcium blocker d/c   Feels better on lopressor Still seems overly concerned about heart Updated echo 04/28/21 stable EF 50-55%   DM poorly controlled with A1c ranging from 13.6 12/19/20 to 8.9 04/27/21 Along with this triglycerides have been elevated   ***  ROS All other systems reviewed and negative except as noted above  Past Medical History:  Diagnosis Date  . Diabetes mellitus without complication ([place])   . GERD (gastroesophageal reflux disease)   . History of chicken pox   . Hx of eating disorder   . Hyperlipidemia     Family History  Problem Relation Age of Onset  . Arthritis Mother   . Heart attack Mother   . Hyperlipidemia Mother   . Kidney disease Mother   . Arthritis Father   . Arthritis Sister   . Hypertension Paternal Uncle   . Hearing loss Sister   . Hypertension Sister   .  Kidney disease Sister   . Diabetes Brother     Social History   Socioeconomic History  . Marital status: Married    Spouse name: Not on file  . Number of children: Not on file  . Years of education: Not on file  . Highest education level: Not on file  Occupational History  . Not on file  Tobacco Use  . Smoking status: Never  . Smokeless tobacco: Never  Vaping Use  . Vaping Use: Never used  Substance and Sexual Activity  . Alcohol use: No  . Drug use: Never  . Sexual activity: Yes  Other Topics Concern  . Not on file  Social History Narrative  . Not on file   Social Determinants of Health   Financial Resource Strain: Not on file  Food Insecurity: Not on file  Transportation Needs: Not on file  Physical Activity: Not on file  Stress: Not on file  Social Connections: Not on file  Intimate Partner Violence: Not on file    Past Surgical History:  Procedure Laterality Date  . ERCP  2017   calcular cholecystitis      Current Outpatient Medications:  .  Accu-Chek Softclix Lancets lancets, Use to test blood sugar 3 times daily., Disp: 100 each, Rfl: 12 .  atorvastatin (LIPITOR) 40 MG tablet, Take 1 tablet (40 mg total) by mouth daily., Disp: 90 tablet, Rfl:  3 .  Betamethasone Valerate 0.12 % foam, Apply 1 application topically 2 (two) times daily as needed., Disp: 100 g, Rfl: 3 .  blood glucose meter kit and supplies, Use to test blood sugar 3 times daily., Disp: 1 each, Rfl: 0 .  Blood Glucose Monitoring Suppl (ACCU-CHEK GUIDE ME) w/Device KIT, Use to test blood sugar 3 times daily, Disp: 1 kit, Rfl: 0 .  dapagliflozin propanediol (FARXIGA) 10 MG TABS tablet, Take 1 tablet (10 mg total) by mouth daily before breakfast., Disp: 90 tablet, Rfl: 3 .  fluticasone (FLONASE) 50 MCG/ACT nasal spray, SHAKE LIQUID AND USE 1 SPRAY IN EACH NOSTRIL TWICE DAILY, Disp: 48 g, Rfl: 0 .  glucose blood (ACCU-CHEK GUIDE) test strip, Use to test blood sugar 3 times daily., Disp: 100 each, Rfl:  12 .  insulin glargine-yfgn (SEMGLEE, YFGN,) 100 UNIT/ML injection, Inject 0.2 mLs (20 Units total) into the skin daily., Disp: 30 mL, Rfl: 3 .  Insulin Pen Needle 32G X 4 MM MISC, 1 Device by Does not apply route in the morning, at noon, in the evening, and at bedtime., Disp: 400 each, Rfl: 3 .  Insulin Regular Human (NOVOLIN R FLEXPEN RELION) 100 UNIT/ML KwikPen, Max daily 50 units, Disp: 45 mL, Rfl: 6 .  ketoconazole (NIZORAL) 2 % shampoo, Apply 1 application topically 2 (two) times a week., Disp: 120 mL, Rfl: 3 .  metFORMIN (GLUCOPHAGE) 500 MG tablet, Take 1 tablet (500 mg total) by mouth 3 (three) times daily., Disp: 270 tablet, Rfl: 3 .  metoprolol tartrate (LOPRESSOR) 25 MG tablet, Take 1 tablet (25 mg total) by mouth 2 (two) times daily., Disp: 180 tablet, Rfl: 3 .  Multiple Vitamin (MULTIVITAMIN) tablet, Take 1 tablet by mouth daily., Disp: , Rfl:  .  pantoprazole (PROTONIX) 40 MG tablet, Take 1 tablet (40 mg total) by mouth daily., Disp: 90 tablet, Rfl: 1    Physical Exam: There were no vitals taken for this visit.    Affect appropriate Healthy:  appears stated age 46: normal Neck supple with no adenopathy JVP normal no bruits no thyromegaly Lungs clear with no wheezing and good diaphragmatic motion Heart:  S1/S2 no murmur, no rub, gallop or click PMI normal Abdomen: benighn, BS positve, no tenderness, no AAA no bruit.  No HSM or HJR Distal pulses intact with no bruits No edema Neuro non-focal Skin warm and dry No muscular weakness   Labs:   Lab Results  Component Value Date   WBC 5.2 03/10/2020   HGB 15.7 03/10/2020   HCT 46.4 03/10/2020   MCV 86.3 03/10/2020   PLT 205.0 03/10/2020   No results for input(s): NA, K, CL, CO2, BUN, CREATININE, CALCIUM, PROT, BILITOT, ALKPHOS, ALT, AST, GLUCOSE in the last 168 hours.  Invalid input(s): LABALBU No results found for: CKTOTAL, CKMB, CKMBINDEX, TROPONINI  Lab Results  Component Value Date   CHOL 206 (H) 03/24/2021    CHOL 244 (H) 12/19/2020   CHOL 183 03/10/2020   Lab Results  Component Value Date   HDL 41.70 03/24/2021   HDL 49.20 12/19/2020   HDL 45.10 03/10/2020   Lab Results  Component Value Date   LDLCALC  11/02/2019     Comment:     . LDL cholesterol not calculated. Triglyceride levels greater than 400 mg/dL invalidate calculated LDL results. . Reference range: <100 . Desirable range <100 mg/dL for primary prevention;   <70 mg/dL for patients with CHD or diabetic patients  with > or = 2 CHD  risk factors. Marland Kitchen LDL-C is now calculated using the Martin-Hopkins  calculation, which is a validated novel method providing  better accuracy than the Friedewald equation in the  estimation of LDL-C.  Cresenciano Genre et al. Annamaria Helling. 2518;984(21): 2061-2068  (http://education.[employer].com/faq/FAQ164)    LDLCALC 152 (H) 06/29/2019   Lab Results  Component Value Date   TRIG 215.0 (H) 03/24/2021   TRIG (H) 12/19/2020    484.0 Triglyceride is over 400; calculations on Lipids are invalid.   TRIG 304.0 (H) 03/10/2020   Lab Results  Component Value Date   CHOLHDL 5 03/24/2021   CHOLHDL 5 12/19/2020   CHOLHDL 4 03/10/2020   Lab Results  Component Value Date   LDLDIRECT 126.0 03/24/2021   LDLDIRECT 97.0 12/19/2020   LDLDIRECT 98.0 03/10/2020      Radiology: No results found.   EKG: NSR rate 76 normal 11/27/19    ASSESSMENT AND PLAN:   1. Tachycardia:  Seems functional no physical signs of pathology Labs ok Baseline ECG normal , ETT Calcium score echo normal observe on beta blocker  2. DM:  Discussed low carb diet.  Target hemoglobin A1c is 6.5 or less.  Continue current medications. F/U [place]  3. HLD:  On statin tighter sugar control and weight loss advised 4. GERD:  On protonix f/u GI  5. CAD:  Risk family history DM continue statin calcium score 0 04/22/20 and normal ETT    F/U in a year   Signed: Jenkins Rouge 07/21/2021, 4:05 PM

## 2021-07-30 ENCOUNTER — Ambulatory Visit: Payer: 59 | Admitting: Cardiovascular Disease

## 2021-08-07 ENCOUNTER — Ambulatory Visit (INDEPENDENT_AMBULATORY_CARE_PROVIDER_SITE_OTHER): Payer: 59

## 2021-08-07 ENCOUNTER — Ambulatory Visit (INDEPENDENT_AMBULATORY_CARE_PROVIDER_SITE_OTHER): Payer: 59 | Admitting: Family Medicine

## 2021-08-07 ENCOUNTER — Encounter: Payer: Self-pay | Admitting: Family Medicine

## 2021-08-07 VITALS — BP 128/70 | HR 100 | Temp 98.5°F | Resp 16 | Ht 68.0 in | Wt 171.4 lb

## 2021-08-07 DIAGNOSIS — L219 Seborrheic dermatitis, unspecified: Secondary | ICD-10-CM

## 2021-08-07 DIAGNOSIS — N200 Calculus of kidney: Secondary | ICD-10-CM | POA: Diagnosis not present

## 2021-08-07 DIAGNOSIS — R109 Unspecified abdominal pain: Secondary | ICD-10-CM | POA: Diagnosis not present

## 2021-08-07 LAB — POCT URINALYSIS DIPSTICK
Bilirubin, UA: NEGATIVE
Blood, UA: NEGATIVE
Glucose, UA: POSITIVE — AB
Ketones, UA: NEGATIVE
Leukocytes, UA: NEGATIVE
Nitrite, UA: NEGATIVE
Protein, UA: NEGATIVE
Spec Grav, UA: 1.015 (ref 1.010–1.025)
Urobilinogen, UA: 0.2 E.U./dL
pH, UA: 5 (ref 5.0–8.0)

## 2021-08-07 LAB — CBC
HCT: 45.2 % (ref 39.0–52.0)
Hemoglobin: 15.5 g/dL (ref 13.0–17.0)
MCHC: 34.2 g/dL (ref 30.0–36.0)
MCV: 86.4 fl (ref 78.0–100.0)
Platelets: 203 10*3/uL (ref 150.0–400.0)
RBC: 5.23 Mil/uL (ref 4.22–5.81)
RDW: 12.8 % (ref 11.5–15.5)
WBC: 5.8 10*3/uL (ref 4.0–10.5)

## 2021-08-07 LAB — COMPREHENSIVE METABOLIC PANEL
ALT: 16 U/L (ref 0–53)
AST: 18 U/L (ref 0–37)
Albumin: 4.3 g/dL (ref 3.5–5.2)
Alkaline Phosphatase: 82 U/L (ref 39–117)
BUN: 15 mg/dL (ref 6–23)
CO2: 29 mEq/L (ref 19–32)
Calcium: 9.3 mg/dL (ref 8.4–10.5)
Chloride: 97 mEq/L (ref 96–112)
Creatinine, Ser: 0.72 mg/dL (ref 0.40–1.50)
GFR: 109.62 mL/min (ref >60.00)
Glucose, Bld: 297 mg/dL — ABNORMAL HIGH (ref 70–99)
Potassium: 3.9 mEq/L (ref 3.5–5.1)
Sodium: 134 mEq/L — ABNORMAL LOW (ref 135–145)
Total Bilirubin: 0.5 mg/dL (ref 0.2–1.2)
Total Protein: 7.3 g/dL (ref 6.0–8.3)

## 2021-08-07 MED ORDER — BETAMETHASONE VALERATE 0.12 % EX FOAM: 1.0000 "application " | Freq: Every day | CUTANEOUS | 3 refills | Status: AC | PRN

## 2021-08-07 NOTE — Patient Instructions (Addendum)
A few things to remember from today's visit:  Flank pain - Plan: DG Abd 1 View, POCT urinalysis dipstick  Seborrheic dermatitis of scalp - Plan: Betamethasone Valerate 0.12 % foam  Nephrolithiasis  If you need refills please call your pharmacy. Do not use My Chart to request refills or for acute issues that need immediate attention.   Urine today negative for blood. Please sent copy of ultrasound report. Pending abdominal X ray. Increase fluid intake consistently.  It could also be musculoskeletal pain.  Please be sure medication list is accurate. If a new problem present, please set up appointment sooner than planned today.

## 2021-08-07 NOTE — Progress Notes (Unsigned)
ACUTE VISIT Chief Complaint  Patient presents with   Flank Pain    Has been fasting, pain is worse during that time. Has a history of kidney stones.    HPI: [name] is a 46 y.o. male with hx of DM II,GERD,cholelithiasis,and HLD here today complaining of right flank pain that has been going on for 3 months. Worse for the past few weeks. He thinks problem is due to kidney stone.  Colic like pain, intermittent,from lateral rib cage area to low back. Today pain is 5/10. Worse when he does not drink enough fluids, during religious fasting periods. Sometimes exacerbated by movement and alleviated by changing positions. He has not noted skin rash. Negative for urinary symptoms.  Flank Pain This is a recurrent problem. The current episode started more than 1 month ago. The problem is unchanged. The pain is present in the lumbar spine. The pain does not radiate. The pain is at a severity of 7/10. The pain is moderate. Pertinent negatives include no abdominal pain, bladder incontinence, bowel incontinence, chest pain, dysuria, fever, leg pain, numbness, paresis, paresthesias, pelvic pain, perianal numbness, weakness or weight loss. He has tried NSAIDs for the symptoms.  Last week he took Ibuprofen. He [place] renal US [place] in [place] country about 2 [place] and a right kidney stone > 1 cm was seen.   03/2015 abdominal US:Non obstructive CBD stone. Negative for renal stones.  Scalp dermatitis have improved, he is on Betamethasone 0.12% foam, which does not use frequency.  He is also using Nizoral shampoo.  Review of Systems  Constitutional:  Negative for activity change, appetite change, chills, fever and weight loss.  Respiratory:  Negative for cough, shortness of breath and wheezing.   Cardiovascular:  Negative for chest pain.  Gastrointestinal:  Negative for abdominal pain and bowel incontinence.  Genitourinary:  Positive for flank pain. Negative for bladder incontinence, dysuria  and pelvic pain.  Neurological:  Negative for weakness, numbness and paresthesias.  Rest see pertinent positives and negatives per HPI.  Current Outpatient Medications on File Prior to Visit  Medication Sig Dispense Refill   Accu-Chek Softclix Lancets lancets Use to test blood sugar 3 times daily. 100 each 12   atorvastatin (LIPITOR) 40 MG tablet Take 1 tablet (40 mg total) by mouth daily. 90 tablet 3   blood glucose meter kit and supplies Use to test blood sugar 3 times daily. 1 each 0   Blood Glucose Monitoring Suppl (ACCU-CHEK GUIDE ME) w/Device KIT Use to test blood sugar 3 times daily 1 kit 0   dapagliflozin propanediol (FARXIGA) 10 MG TABS tablet Take 1 tablet (10 mg total) by mouth daily before breakfast. 90 tablet 3   fluticasone (FLONASE) 50 MCG/ACT nasal spray SHAKE LIQUID AND USE 1 SPRAY IN EACH NOSTRIL TWICE DAILY 48 g 0   glucose blood (ACCU-CHEK GUIDE) test strip Use to test blood sugar 3 times daily. 100 each 12   insulin glargine-yfgn (SEMGLEE, YFGN,) 100 UNIT/ML injection Inject 0.2 mLs (20 Units total) into the skin daily. 30 mL 3   Insulin Pen Needle 32G X 4 MM MISC 1 Device by Does not apply route in the morning, at noon, in the evening, and at bedtime. 400 each 3   Insulin Regular Human (NOVOLIN R FLEXPEN RELION) 100 UNIT/ML KwikPen Max daily 50 units 45 mL 6   ketoconazole (NIZORAL) 2 % shampoo Apply 1 application topically 2 (two) times a week. 120 mL 3   metFORMIN (GLUCOPHAGE) 500 MG  tablet Take 1 tablet (500 mg total) by mouth 3 (three) times daily. 270 tablet 3   metoprolol tartrate (LOPRESSOR) 25 MG tablet Take 1 tablet (25 mg total) by mouth 2 (two) times daily. 180 tablet 3   Multiple Vitamin (MULTIVITAMIN) tablet Take 1 tablet by mouth daily.     pantoprazole (PROTONIX) 40 MG tablet Take 1 tablet (40 mg total) by mouth daily. 90 tablet 1   No current facility-administered medications on file prior to visit.   Past Medical History:  Diagnosis Date   Diabetes  mellitus without complication (HCC)    GERD (gastroesophageal reflux disease)    History of chicken pox    Hx of eating disorder    Hyperlipidemia    No Known Allergies  Social History   Socioeconomic History   Marital status: Married    Spouse name: Not on file   Number of children: Not on file   Years of education: Not on file   Highest education level: Not on file  Occupational History   Not on file  Tobacco Use   Smoking status: Never   Smokeless tobacco: Never  Vaping Use   Vaping Use: Never used  Substance and Sexual Activity   Alcohol use: No   Drug use: Never   Sexual activity: Yes  Other Topics Concern   Not on file  Social History Narrative   Not on file   Social Determinants of Health   Financial Resource Strain: Not on file  Food Insecurity: Not on file  Transportation Needs: Not on file  Physical Activity: Not on file  Stress: Not on file  Social Connections: Not on file   Vitals:   08/07/21 1125  BP: 128/70  Pulse: 100  Resp: 16  Temp: 98.5 F (36.9 C)  SpO2: 98%   Body mass index is 26.06 kg/m.  Physical Exam Vitals and nursing note reviewed.  Constitutional:      General: He is not in acute distress.    Appearance: He is well-developed.  HENT:     Head: Normocephalic and atraumatic.     Mouth/Throat:     Mouth: Mucous membranes are moist.  Eyes:     Conjunctiva/sclera: Conjunctivae normal.  Cardiovascular:     Rate and Rhythm: Normal rate and regular rhythm.     Heart sounds: No murmur heard. Pulmonary:     Effort: Pulmonary effort is normal. No respiratory distress.     Breath sounds: Normal breath sounds.  Abdominal:     Palpations: Abdomen is soft. There is no mass.     Tenderness: There is abdominal tenderness in the right upper quadrant. There is no right CVA tenderness, left CVA tenderness, guarding or rebound.    Skin:    General: Skin is warm.     Findings: No erythema.  Neurological:     Mental Status: He is alert  and oriented to person, place, and time.   ASSESSMENT AND PLAN:  [name] was seen today for flank pain.  Diagnoses and all orders for this visit: Orders Placed This Encounter  Procedures   DG Abd 1 View   Comprehensive metabolic panel   CBC   POCT urinalysis dipstick   Lab Results  Component Value Date   WBC 5.8 08/07/2021   HGB 15.5 08/07/2021   HCT 45.2 08/07/2021   MCV 86.4 08/07/2021   PLT 203.0 08/07/2021   Lab Results  Component Value Date   CREATININE 0.72 08/07/2021   BUN 15 08/07/2021     NA 134 (L) 08/07/2021   K 3.9 08/07/2021   CL 97 08/07/2021   CO2 29 08/07/2021   Lab Results  Component Value Date   ALT 16 08/07/2021   AST 18 08/07/2021   ALKPHOS 82 08/07/2021   BILITOT 0.5 08/07/2021   Flank pain We discussed possible etiologies, including nephrolithiasis,cholelithiasis,and musculoskeletal. He has had similar symptoms when dx'ed with renal colic. Urine dipstick negative for blood. KUB ordered today. Depending of results will consider RUQ [place] or renal CT. Adequate hydration. Instructed about warning signs.  Seborrheic dermatitis of scalp Well controlled. No changes in current management. Some side effects of chronic topical steroid discussed.  -     Betamethasone Valerate 0.12 % foam; Apply 1 application  topically daily as needed.  Nephrolithiasis Increase fluid intake. Urine strainer give.  I spent a total of 33 minutes in both face to face and non face to face activities for this visit on the date of this encounter. During this time history was obtained and documented, examination was performed, prior labs/imaging reviewed, and assessment/plan discussed.  Return if symptoms worsen or fail to improve.  Roshawn Ayala G. [place], MD  [facility]. [place] office.

## 2021-08-17 ENCOUNTER — Other Ambulatory Visit: Payer: Self-pay

## 2021-08-17 DIAGNOSIS — R109 Unspecified abdominal pain: Secondary | ICD-10-CM

## 2021-08-22 ENCOUNTER — Ambulatory Visit (HOSPITAL_BASED_OUTPATIENT_CLINIC_OR_DEPARTMENT_OTHER)
Admission: RE | Admit: 2021-08-22 | Discharge: 2021-08-22 | Disposition: A | Discharge status: Home / Self Care | Payer: 59 | Source: Ambulatory Visit | Attending: Family Medicine | Admitting: Family Medicine

## 2021-08-22 DIAGNOSIS — R109 Unspecified abdominal pain: Secondary | ICD-10-CM | POA: Insufficient documentation

## 2021-10-19 ENCOUNTER — Ambulatory Visit: Payer: 59 | Admitting: Cardiovascular Disease

## 2021-10-28 ENCOUNTER — Other Ambulatory Visit: Payer: Self-pay

## 2021-10-28 ENCOUNTER — Ambulatory Visit: Payer: 59 | Admitting: Internal Medicine

## 2021-10-28 DIAGNOSIS — E1169 Type 2 diabetes mellitus with other specified complication: Secondary | ICD-10-CM

## 2021-10-28 MED ORDER — INSULIN GLARGINE-YFGN 100 UNIT/ML ~~LOC~~ SOLN: 20.0000 [IU] | Freq: Every day | SUBCUTANEOUS | 0 refills | Status: DC

## 2021-10-28 MED ORDER — DAPAGLIFLOZIN PROPANEDIOL 10 MG PO TABS: 10.0000 mg | ORAL_TABLET | Freq: Every day | ORAL | 0 refills | Status: DC

## 2022-01-06 ENCOUNTER — Other Ambulatory Visit: Payer: Self-pay | Admitting: Internal Medicine

## 2022-01-06 DIAGNOSIS — E1169 Type 2 diabetes mellitus with other specified complication: Secondary | ICD-10-CM

## 2022-01-13 ENCOUNTER — Encounter: Payer: Self-pay | Admitting: Internal Medicine

## 2022-01-13 ENCOUNTER — Other Ambulatory Visit: Payer: Self-pay | Admitting: Family Medicine

## 2022-01-13 ENCOUNTER — Other Ambulatory Visit: Payer: Self-pay | Admitting: Internal Medicine

## 2022-01-13 DIAGNOSIS — E1169 Type 2 diabetes mellitus with other specified complication: Secondary | ICD-10-CM

## 2022-01-13 MED ORDER — ONE-DAILY MULTI VITAMINS PO TABS
1.0000 | ORAL_TABLET | Freq: Every day | ORAL | 3 refills | Status: AC
  Filled 2022-01-13 – 2022-07-04 (×4): qty 30, 30d supply, fill #0

## 2022-01-14 ENCOUNTER — Other Ambulatory Visit (HOSPITAL_COMMUNITY): Payer: Self-pay

## 2022-01-14 ENCOUNTER — Other Ambulatory Visit: Payer: Self-pay | Admitting: Internal Medicine

## 2022-01-14 DIAGNOSIS — E785 Hyperlipidemia, unspecified: Secondary | ICD-10-CM

## 2022-01-14 DIAGNOSIS — E1169 Type 2 diabetes mellitus with other specified complication: Secondary | ICD-10-CM

## 2022-01-14 DIAGNOSIS — E1165 Type 2 diabetes mellitus with hyperglycemia: Secondary | ICD-10-CM

## 2022-01-14 MED ORDER — INSULIN PEN NEEDLE 32G X 4 MM MISC
1.0000 | Freq: Four times a day (QID) | 2 refills | Status: DC
  Filled 2022-01-14: qty 400, 90d supply, fill #0

## 2022-01-14 MED ORDER — NOVOLIN R FLEXPEN 100 UNIT/ML IJ SOPN
30.0000 [IU] | PEN_INJECTOR | Freq: Every day | INTRAMUSCULAR | 2 refills | Status: DC
  Filled 2022-01-14 – 2022-01-15 (×2): qty 9, 30d supply, fill #0

## 2022-01-14 MED ORDER — INSULIN GLARGINE-YFGN 100 UNIT/ML ~~LOC~~ SOPN
20.0000 [IU] | PEN_INJECTOR | Freq: Every day | SUBCUTANEOUS | 2 refills | Status: DC
  Filled 2022-01-14: qty 18, 90d supply, fill #0
  Filled 2022-01-14: qty 6, 30d supply, fill #0
  Filled 2022-01-15: qty 30, 150d supply, fill #0

## 2022-01-14 NOTE — Telephone Encounter (Signed)
Patient will do virtual visit but would like to come in before appointment date and do labs.

## 2022-01-14 NOTE — Telephone Encounter (Signed)
Novolin R needs PA

## 2022-01-14 NOTE — Telephone Encounter (Signed)
Patient will callback to get on lab schedule. Also advised to bring blood sugar logs in

## 2022-01-15 ENCOUNTER — Encounter: Payer: Self-pay | Admitting: Internal Medicine

## 2022-01-15 ENCOUNTER — Other Ambulatory Visit (HOSPITAL_COMMUNITY): Payer: Self-pay

## 2022-01-15 ENCOUNTER — Other Ambulatory Visit: Payer: Self-pay | Admitting: Internal Medicine

## 2022-01-15 DIAGNOSIS — Z794 Long term (current) use of insulin: Secondary | ICD-10-CM

## 2022-01-15 MED ORDER — INSULIN LISPRO (1 UNIT DIAL) 100 UNIT/ML (KWIKPEN)
30.0000 [IU] | PEN_INJECTOR | Freq: Every day | SUBCUTANEOUS | 2 refills | Status: DC
  Filled 2022-01-15: qty 9, 30d supply, fill #0
  Filled 2022-03-29 – 2022-07-04 (×2): qty 9, 30d supply, fill #1

## 2022-01-15 MED ORDER — LANTUS SOLOSTAR 100 UNIT/ML ~~LOC~~ SOPN
20.0000 [IU] | PEN_INJECTOR | Freq: Every day | SUBCUTANEOUS | 6 refills | Status: DC
  Filled 2022-01-15: qty 15, 75d supply, fill #0

## 2022-01-15 NOTE — Telephone Encounter (Signed)
[facility] pharmacy states that the [facility] and Novolin R are not coverd by insurance and would like script sent for Humalog Kwikpen and Lantus pen.

## 2022-01-18 ENCOUNTER — Other Ambulatory Visit (HOSPITAL_COMMUNITY): Payer: Self-pay

## 2022-01-19 ENCOUNTER — Other Ambulatory Visit (HOSPITAL_COMMUNITY): Payer: Self-pay

## 2022-01-20 ENCOUNTER — Other Ambulatory Visit (HOSPITAL_COMMUNITY): Payer: Self-pay

## 2022-01-20 ENCOUNTER — Telehealth: Payer: Self-pay

## 2022-01-20 NOTE — Telephone Encounter (Signed)
Pharmacy Patient Advocate Encounter   Received notification from [facility] that prior authorization for Semglee 100u/ml pen is required/requested.  Per Test Claim: Semglee is not covered. Did a test claim for Basaglar and the copay was $25.00.  Lantus was lasted called in for the patient.    PA prior Berkley Harvey has not been submitted at this time.

## 2022-02-10 ENCOUNTER — Other Ambulatory Visit (HOSPITAL_COMMUNITY): Payer: Self-pay

## 2022-02-12 ENCOUNTER — Other Ambulatory Visit (INDEPENDENT_AMBULATORY_CARE_PROVIDER_SITE_OTHER): Payer: Commercial Managed Care - HMO

## 2022-02-12 DIAGNOSIS — E1165 Type 2 diabetes mellitus with hyperglycemia: Secondary | ICD-10-CM

## 2022-02-12 DIAGNOSIS — E785 Hyperlipidemia, unspecified: Secondary | ICD-10-CM

## 2022-02-12 DIAGNOSIS — Z794 Long term (current) use of insulin: Secondary | ICD-10-CM

## 2022-02-12 LAB — BASIC METABOLIC PANEL
BUN: 17 mg/dL (ref 6–23)
CO2: 27 mEq/L (ref 19–32)
Calcium: 9.9 mg/dL (ref 8.4–10.5)
Chloride: 101 mEq/L (ref 96–112)
Creatinine, Ser: 0.89 mg/dL (ref 0.40–1.50)
GFR: 102.45 mL/min (ref >60.00)
Glucose, Bld: 177 mg/dL — ABNORMAL HIGH (ref 70–99)
Potassium: 4.5 mEq/L (ref 3.5–5.1)
Sodium: 137 mEq/L (ref 135–145)

## 2022-02-12 LAB — LIPID PANEL
Cholesterol: 197 mg/dL (ref 0–200)
HDL: 52.9 mg/dL (ref >39.00)
LDL Cholesterol: 118 mg/dL — ABNORMAL HIGH (ref 0–99)
NonHDL: 144.42
Total CHOL/HDL Ratio: 4
Triglycerides: 134 mg/dL (ref 0.0–149.0)
VLDL: 26.8 mg/dL (ref 0.0–40.0)

## 2022-02-12 LAB — HEMOGLOBIN A1C: Hgb A1c MFr Bld: 8.3 % — ABNORMAL HIGH (ref 4.6–6.5)

## 2022-02-12 LAB — MICROALBUMIN / CREATININE URINE RATIO
Creatinine,U: 64.3 mg/dL
Microalb Creat Ratio: 1.9 mg/g (ref 0.0–30.0)
Microalb, Ur: 1.2 mg/dL (ref 0.0–1.9)

## 2022-03-01 ENCOUNTER — Ambulatory Visit: Payer: Self-pay

## 2022-03-04 ENCOUNTER — Encounter: Payer: Self-pay | Admitting: Internal Medicine

## 2022-03-04 NOTE — Progress Notes (Signed)
Virtual Visit via Video Note  I connected with Mitchell Mahgoub Eltayebon 03/05/22 at 7:30 am by a video enabled telemedicine application and verified that I am speaking with the correct person using two identifiers.   I discussed the limitations of evaluation and management by telemedicine and the availability of in person appointments. The patient expressed understanding and agreed to proceed.   -Location of the patient :car -Location of the provider : Office -The names of all persons participating in the telemedicine service : Pt and myself       Name: Johnathan Mahgoub Defino  Age/ Sex: 47 y.o., male   MRN/ DOB: [id number], March 23, 1975     PCP: [place], Betty G, MD   Reason for Endocrinology Evaluation: Type 2 Diabetes Mellitus  Initial Endocrine Consultative Visit: 08/03/2019    PATIENT IDENTIFIER: Mr. Hadriel Northup Harold is a 47 y.o. male with a past medical history of  T2DM, CKD, Hx of pancreatitis due to gallstones (S/P ERCP) , renal stones , fatty liver  and dyslipidemia .Marland Kitchen The patient has followed with Endocrinology clinic since 08/03/2019 for consultative assistance with management of his diabetes.  DIABETIC HISTORY:  Mr. Kitchings was diagnosed with DM in 2013. He was on Metformin 750 mg ER but he believes this wasn't absorbed so switched to 500 mg tabs and doing well. Januvia switched to Trulicity in 09/5025,XA was also on  Dimacron .   His hemoglobin A1c has ranged from 8.0% , peaking at 10.7% %  in 2021   Pt chronic GI issues in the form of cramps, pain and occasional regurgitation     On his initial visit to our clinic his A1c was 10.7% , he was on metformin, trulicity and Metformin , and basal insulin, no changes were made as he was recently started on this regimen.   Trulicity was discontinued due to recurrent history of pancreatitis by 07/2019 Attempted to start SGLT-2 inhibitors in 2022 but recurrent history of medication interruption for variable reasons mainly pharmacy/  insurance issues  Started Regular insulin 11/2020 with an A1c 13.6%   SUBJECTIVE:   During the last visit (04/27/2021): A1c 8.9%      Today (03/04/2022): Mr. Shahan is here for a follow up on diabetes management.  He checks his glucose occasionally .   Patient states he has been out of basal insulin for months at a time due to and availability at the pharmacy.  He used to use [facility] switch to [facility] I had switched his insulin to the recommended brand twice to [facility] but the patient states he has not been able to get it  Due to lack of basal insulin, the patient has increased his Humalog 10-12 units twice daily  He self discontinued atorvastatin and has been working on dietary modifications   Denies nausea, vomiting or diarrhea or any heartburn issues   HOME DIABETES REGIMEN:  - Lantus 20 units daily-not taking - Metformin 500 mg, 3 tabs daily  - Farxiga 10 mg daily - Novolin-R 4 units TIDQAC - takes 10-12 units twice day   - CF: Novolin-R (BG-130/35)   Statin: Yes ACE-I/ARB: no    METER DOWNLOAD SUMMARY: Did not bring     DIABETIC COMPLICATIONS: Microvascular complications:   Denies: neuropathy, retinopathy and CKD Last Eye Exam: Completed 2021  Macrovascular complications:   Denies: CAD, CVA, PVD   HISTORY:  Past Medical History:  Past Medical History:  Diagnosis Date   Diabetes mellitus without complication ([place])    GERD (  gastroesophageal reflux disease)    History of chicken pox    Hx of eating disorder    Hyperlipidemia    Past Surgical History:  Past Surgical History:  Procedure Laterality Date   ERCP  2017   calcular cholecystitis   Social History:  reports that he has never smoked. He has never used smokeless tobacco. He reports that he does not drink alcohol and does not use drugs. Family History:  Family History  Problem Relation Age of Onset   Arthritis Mother    Heart attack Mother    Hyperlipidemia Mother    Kidney  disease Mother    Arthritis Father    Arthritis Sister    Hypertension Paternal Uncle    Hearing loss Sister    Hypertension Sister    Kidney disease Sister    Diabetes Brother      HOME MEDICATIONS: Allergies as of 03/05/2022   No Known Allergies      Medication List        Accurate as of March 04, 2022  1:29 PM. If you have any questions, ask your nurse or doctor.          Accu-Chek Guide Me w/Device Kit Use to test blood sugar 3 times daily   Accu-Chek Guide test strip Generic drug: glucose blood Use to test blood sugar 3 times daily.   Accu-Chek Softclix Lancets lancets Use to test blood sugar 3 times daily.   atorvastatin 40 MG tablet Commonly known as: LIPITOR Take 1 tablet (40 mg total) by mouth daily.   Betamethasone Valerate 0.12 % foam Apply 1 application  topically daily as needed.   blood glucose meter kit and supplies Use to test blood sugar 3 times daily.   dapagliflozin propanediol 10 MG Tabs tablet Commonly known as: Farxiga Take 1 tablet (10 mg total) by mouth daily before breakfast.   fluticasone 50 MCG/ACT nasal spray Commonly known as: FLONASE SHAKE LIQUID AND USE 1 SPRAY IN EACH NOSTRIL TWICE DAILY   HumaLOG KwikPen 100 UNIT/ML KwikPen Generic drug: insulin lispro Inject 30 Units into the skin daily.   ketoconazole 2 % shampoo Commonly known as: Nizoral Apply 1 application topically 2 (two) times a week.   Lantus SoloStar 100 UNIT/ML Solostar Pen Generic drug: insulin glargine Inject 20 Units into the skin daily.   metFORMIN 500 MG tablet Commonly known as: GLUCOPHAGE Take 1 tablet (500 mg total) by mouth 3 (three) times daily.   metoprolol tartrate 25 MG tablet Commonly known as: LOPRESSOR Take 1 tablet (25 mg total) by mouth 2 (two) times daily.   multivitamin tablet Take 1 tablet by mouth daily.   pantoprazole 40 MG tablet Commonly known as: PROTONIX Take 1 tablet (40 mg total) by mouth daily.   TechLite Pen  Needles 32G X 4 MM Misc Generic drug: Insulin Pen Needle Use 1 pen in the morning, at noon, in the evening, and at bedtime.         OBJECTIVE:   Vital Signs: There were no vitals taken for this visit.   Wt Readings from Last 3 Encounters:  08/07/21 171 lb 6 oz (77.7 kg)  04/27/21 177 lb (80.3 kg)  03/24/21 172 lb 6 oz (78.2 kg)     Exam: General: Pt appears well and is in NAD  Neuro: MS is good with appropriate affect, pt is alert and Ox3    DM foot exam: 04/27/2021 The skin of the feet is intact without sores or ulcerations. The pedal  pulses are 2+ on right and 2+ on left. The sensation is intact to a screening 5.07, 10 gram monofilament bilaterally           DATA REVIEWED:  Lab Results  Component Value Date   HGBA1C 8.3 (H) 02/12/2022   HGBA1C 8.9 (A) 04/27/2021   HGBA1C 13.6 (A) 12/19/2020    Latest Reference Range & Units 02/12/22 08:40  Sodium 135 - 145 mEq/L 137  Potassium 3.5 - 5.1 mEq/L 4.5  Chloride 96 - 112 mEq/L 101  CO2 19 - 32 mEq/L 27  Glucose 70 - 99 mg/dL 177 (H)  BUN 6 - 23 mg/dL 17  Creatinine 0.40 - 1.50 mg/dL 0.89  Calcium 8.4 - 10.5 mg/dL 9.9  GFR >60.00 mL/min 102.45    Latest Reference Range & Units 02/12/22 08:40  Total CHOL/HDL Ratio  4  Cholesterol 0 - 200 mg/dL 197  HDL Cholesterol >39.00 mg/dL 52.90  LDL (calc) 0 - 99 mg/dL 118 (H)  MICROALB/CREAT RATIO 0.0 - 30.0 mg/g 1.9  NonHDL  144.42  Triglycerides 0.0 - 149.0 mg/dL 134.0  VLDL 0.0 - 40.0 mg/dL 26.8     Latest Reference Range & Units 02/12/22 08:40  Creatinine,U mg/dL 64.3  Microalb, Ur 0.0 - 1.9 mg/dL 1.2  MICROALB/CREAT RATIO 0.0 - 30.0 mg/g 1.9     ASSESSMENT / PLAN / RECOMMENDATIONS:     1) Type 2 Diabetes Mellitus, Poorly controlled, with improving glycemic control, Without complications - Most recent A1c of 8.3 %. Goal A1c < 7.0 %.    -A1c remains above goal -He has been again with basal insulin for months due to pharmacy issues, he started a new  health plan this year and will change his pharmacy  -Due to lack of basal insulin he has self increased his Humalog that he only takes twice daily due to work schedule -Patient does notice elevated fasting BG's, I did explain to him this is due to the lack of basal insulin, and once he starts basal insulin he will need to reduce his Humalog 6-8 units twice daily -Not a candidate for GLP-1 agonist nor DPP 4 inhibitors due to recurrent pancreatitis    MEDICATIONS:  -Restart Lantus 20 units daily -Continue  Metformin 500 mg, three tablets daily  -Continue Farxiga 10 mg daily -Continue Humalog 6-8 units with breakfast and supper -Correction factor: Humalog (BG -130/35)   EDUCATION / INSTRUCTIONS: BG monitoring instructions: Patient is instructed to check his blood sugars 1 times a day, fasting . Call [facility] if: BG persistently < 70 I reviewed the Rule of 15 for the treatment of hypoglycemia in detail with the patient. Literature supplied.    2) Diabetic complications:  Eye: Does not have known diabetic retinopathy.  Neuro/ Feet: Does not have known diabetic peripheral neuropathy .  Renal: Patient does not have known baseline CKD. He   is not on an ACEI/ARB at present.   3) Dyslipidemia:  -His TG was  elevated to 484 mg/DL in 11/2020 . We increased Atorvastatin and started fish oil 11/2020 with repeat Tg at 215 mg/dL in 03/2021 -His most recent triglycerides have normalized with dietary modifications at 134 mg/DL, he has self discontinued atorvastatin after he implemented low-fat diet but unfortunately his LDL has increased and is currently above goal -We discussed the benefit of statin therapy in reducing the risk of cardiovascular and strokes -He did not have any side effects to atorvastatin and he is willing to go back on  Medication Restart atorvastatin 40 mg daily   F/U in 6 months    Signed electronically by: Mack Guise, MD  [facility]  Endocrinology  [place] [facility] [address] [place], [place] [address] Phone: [phone] FAX: [phone]   CC: [place], Betty G, [place] [address] Phone: [phone]  Fax: [phone]  Return to Endocrinology clinic as below: Future Appointments  Date Time Provider [place]  03/05/2022  7:30 AM Lark Runk, Melanie Crazier, MD [facility] None  03/05/2022  6:00 PM [facility] PROVIDER [place] None  03/26/2022  8:30 AM Martin[place], Betty G, MD [facility]

## 2022-03-05 ENCOUNTER — Ambulatory Visit (HOSPITAL_COMMUNITY): Payer: Self-pay

## 2022-03-05 ENCOUNTER — Other Ambulatory Visit (HOSPITAL_COMMUNITY): Payer: Self-pay

## 2022-03-05 ENCOUNTER — Encounter: Payer: Self-pay | Admitting: Internal Medicine

## 2022-03-05 ENCOUNTER — Telehealth (INDEPENDENT_AMBULATORY_CARE_PROVIDER_SITE_OTHER): Payer: BLUE CROSS/BLUE SHIELD | Admitting: Internal Medicine

## 2022-03-05 VITALS — BP 125/81 | HR 87 | Ht 68.0 in

## 2022-03-05 DIAGNOSIS — Z794 Long term (current) use of insulin: Secondary | ICD-10-CM

## 2022-03-05 DIAGNOSIS — E1169 Type 2 diabetes mellitus with other specified complication: Secondary | ICD-10-CM | POA: Diagnosis not present

## 2022-03-05 DIAGNOSIS — E1165 Type 2 diabetes mellitus with hyperglycemia: Secondary | ICD-10-CM

## 2022-03-05 DIAGNOSIS — E785 Hyperlipidemia, unspecified: Secondary | ICD-10-CM | POA: Diagnosis not present

## 2022-03-05 MED ORDER — METFORMIN HCL 500 MG PO TABS: 500.0000 mg | ORAL_TABLET | Freq: Three times a day (TID) | ORAL | 3 refills | Status: DC

## 2022-03-05 MED ORDER — ATORVASTATIN CALCIUM 40 MG PO TABS: 40.0000 mg | ORAL_TABLET | Freq: Every day | ORAL | 3 refills | Status: AC

## 2022-03-05 MED ORDER — INSULIN PEN NEEDLE 32G X 4 MM MISC: 1.0000 | Freq: Four times a day (QID) | 2 refills | Status: DC

## 2022-03-05 MED ORDER — LANTUS SOLOSTAR 100 UNIT/ML ~~LOC~~ SOPN: 20.0000 [IU] | PEN_INJECTOR | Freq: Every day | SUBCUTANEOUS | 6 refills | Status: DC

## 2022-03-05 MED ORDER — DAPAGLIFLOZIN PROPANEDIOL 10 MG PO TABS: 10.0000 mg | ORAL_TABLET | Freq: Every day | ORAL | 3 refills | Status: DC

## 2022-03-09 ENCOUNTER — Telehealth: Payer: Self-pay

## 2022-03-09 ENCOUNTER — Other Ambulatory Visit (HOSPITAL_COMMUNITY): Payer: Self-pay

## 2022-03-09 NOTE — Telephone Encounter (Signed)
Patient Advocate Encounter   Received notification from [facility] that prior authorization is required for Insulin Glargine-yfgn 100UNIT/ML pen-injectors  This medication does not require a prior authorization if processed as name brand Semglee.  Co-pay is $15.00  No authorization request submitted as of this time.

## 2022-03-26 ENCOUNTER — Encounter: Payer: 59 | Admitting: Family Medicine

## 2022-03-29 ENCOUNTER — Other Ambulatory Visit (HOSPITAL_COMMUNITY): Payer: Self-pay

## 2022-03-30 ENCOUNTER — Other Ambulatory Visit: Payer: Self-pay

## 2022-03-31 ENCOUNTER — Other Ambulatory Visit (HOSPITAL_COMMUNITY): Payer: Self-pay

## 2022-03-31 ENCOUNTER — Other Ambulatory Visit: Payer: Self-pay

## 2022-03-31 ENCOUNTER — Other Ambulatory Visit: Payer: Self-pay | Admitting: Internal Medicine

## 2022-04-01 ENCOUNTER — Other Ambulatory Visit (HOSPITAL_COMMUNITY): Payer: Self-pay

## 2022-04-01 ENCOUNTER — Other Ambulatory Visit: Payer: Self-pay | Admitting: Internal Medicine

## 2022-04-01 MED ORDER — NOVOLOG FLEXPEN 100 UNIT/ML ~~LOC~~ SOPN
30.0000 [IU] | PEN_INJECTOR | Freq: Every day | SUBCUTANEOUS | 3 refills | Status: DC
  Filled 2022-04-01: qty 27, 90d supply, fill #0
  Filled 2022-07-04 – 2022-07-29 (×2): qty 27, 90d supply, fill #1
  Filled 2023-02-28: qty 27, 90d supply, fill #2

## 2022-04-12 ENCOUNTER — Encounter: Payer: 59 | Admitting: Family Medicine

## 2022-04-26 NOTE — Progress Notes (Deleted)
HPI:  [name] is a 47 y.o.male here today for his routine physical examination.  Last CPE: 03/24/21  Regular exercise 3 or more times per week: *** Following a healthy diet: ***  Chronic medical problems: ***  Immunization History  Administered Date(s) Administered  . Janssen (J&J) SARS-COV-2 Vaccination 05/28/2019, 05/28/2019  . Pneumococcal Polysaccharide-23 03/10/2020  . Tdap 03/10/2020    Health Maintenance  Topic Date Due  . OPHTHALMOLOGY EXAM  Never done  . COLONOSCOPY (Pts 45-83yr Insurance coverage will need to be confirmed)  Never done  . FOOT EXAM  06/28/2020  . INFLUENZA VACCINE  Never done  . COVID-19 Vaccine (3 - 2023-24 season) 10/30/2021  . HIV Screening  03/11/2023 (Originally 03/01/1990)  . HEMOGLOBIN A1C  08/14/2022  . Diabetic kidney evaluation - eGFR measurement  02/13/2023  . Diabetic kidney evaluation - Urine ACR  02/13/2023  . DTaP/Tdap/Td (2 - Td or Tdap) 03/10/2030  . Hepatitis C Screening  Completed  . HPV VACCINES  Aged Out    Last prostate ca screening: ***  -Negative for high alcohol intake or tobacco use.  -Concerns and/or follow up today: ***  Review of Systems  Current Outpatient Medications on File Prior to Visit  Medication Sig Dispense Refill  . insulin aspart (NOVOLOG FLEXPEN) 100 UNIT/ML FlexPen Inject a max of 30 units into the skin daily as directed 30 mL 3  . Accu-Chek Softclix Lancets lancets Use to test blood sugar 3 times daily. 100 each 12  . amoxicillin (AMOXIL) 500 MG capsule Take 500 mg by mouth 2 (two) times daily.    .Marland Kitchenatorvastatin (LIPITOR) 40 MG tablet Take 1 tablet (40 mg total) by mouth daily. 90 tablet 3  . Betamethasone Valerate 0.12 % foam Apply 1 application  topically daily as needed. 100 g 3  . blood glucose meter kit and supplies Use to test blood sugar 3 times daily. 1 each 0  . Blood Glucose Monitoring Suppl (ACCU-CHEK GUIDE ME) w/Device KIT Use to test blood sugar 3 times daily 1 kit 0   . dapagliflozin propanediol (FARXIGA) 10 MG TABS tablet Take 1 tablet (10 mg total) by mouth daily before breakfast. 90 tablet 3  . fluticasone (FLONASE) 50 MCG/ACT nasal spray SHAKE LIQUID AND USE 1 SPRAY IN EACH NOSTRIL TWICE DAILY (Patient not taking: Reported on 03/05/2022) 48 g 0  . glucose blood (ACCU-CHEK GUIDE) test strip Use to test blood sugar 3 times daily. 100 each 12  . insulin glargine (LANTUS SOLOSTAR) 100 UNIT/ML Solostar Pen Inject 20 Units into the skin daily. 15 mL 6  . insulin lispro (HUMALOG KWIKPEN) 100 UNIT/ML KwikPen Inject 30 Units into the skin daily. 30 mL 2  . Insulin Pen Needle 32G X 4 MM MISC Use 1 pen in the morning, at noon, in the evening, and at bedtime. 400 each 2  . ketoconazole (NIZORAL) 2 % shampoo Apply 1 application topically 2 (two) times a week. 120 mL 3  . metFORMIN (GLUCOPHAGE) 500 MG tablet Take 1 tablet (500 mg total) by mouth 3 (three) times daily. 270 tablet 3  . metoprolol tartrate (LOPRESSOR) 25 MG tablet Take 1 tablet (25 mg total) by mouth 2 (two) times daily. (Patient not taking: Reported on 03/05/2022) 180 tablet 3  . Multiple Vitamin (MULTIVITAMIN) tablet Take 1 tablet by mouth daily. 30 tablet 3  . pantoprazole (PROTONIX) 40 MG tablet Take 1 tablet (40 mg total) by mouth daily. 90 tablet 1   No current  facility-administered medications on file prior to visit.    Past Medical History:  Diagnosis Date  . Diabetes mellitus without complication ([place])   . GERD (gastroesophageal reflux disease)   . History of chicken pox   . Hx of eating disorder   . Hyperlipidemia     Past Surgical History:  Procedure Laterality Date  . ERCP  2017   calcular cholecystitis    No Known Allergies  Family History  Problem Relation Age of Onset  . Arthritis Mother   . Heart attack Mother   . Hyperlipidemia Mother   . Kidney disease Mother   . Arthritis Father   . Arthritis Sister   . Hypertension Paternal Uncle   . Hearing loss Sister   .  Hypertension Sister   . Kidney disease Sister   . Diabetes Brother     Social History   Socioeconomic History  . Marital status: Married    Spouse name: Not on file  . Number of children: Not on file  . Years of education: Not on file  . Highest education level: Not on file  Occupational History  . Not on file  Tobacco Use  . Smoking status: Never  . Smokeless tobacco: Never  Vaping Use  . Vaping Use: Never used  Substance and Sexual Activity  . Alcohol use: No  . Drug use: Never  . Sexual activity: Yes  Other Topics Concern  . Not on file  Social History Narrative  . Not on file   Social Determinants of Health   Financial Resource Strain: Not on file  Food Insecurity: Not on file  Transportation Needs: Not on file  Physical Activity: Not on file  Stress: Not on file  Social Connections: Not on file    There were no vitals filed for this visit. There is no height or weight on file to calculate BMI.  Wt Readings from Last 3 Encounters:  08/07/21 171 lb 6 oz (77.7 kg)  04/27/21 177 lb (80.3 kg)  03/24/21 172 lb 6 oz (78.2 kg)    Physical Exam Vitals and nursing note reviewed.  Constitutional:      General: He is not in acute distress.    Appearance: He is well-developed.  HENT:     Head: Normocephalic and atraumatic.     Right Ear: Tympanic membrane, ear canal and external ear normal.     Left Ear: Tympanic membrane, ear canal and external ear normal.  Eyes:     Extraocular Movements: Extraocular movements intact.     Conjunctiva/sclera: Conjunctivae normal.     Pupils: Pupils are equal, round, and reactive to light.  Neck:     Thyroid: No thyromegaly.     Trachea: No tracheal deviation.  Cardiovascular:     Rate and Rhythm: Normal rate and regular rhythm.     Pulses:          Dorsalis pedis pulses are 2+ on the right side and 2+ on the left side.     Heart sounds: No murmur heard. Pulmonary:     Effort: Pulmonary effort is normal. No respiratory  distress.     Breath sounds: Normal breath sounds.  Abdominal:     Palpations: Abdomen is soft. There is no hepatomegaly or mass.     Tenderness: There is no abdominal tenderness.  Genitourinary:    Comments: No concerns. Musculoskeletal:        General: No tenderness.     Cervical back: Normal range of motion.  Comments: No major deformities appreciated and no signs of synovitis.  Lymphadenopathy:     Cervical: No cervical adenopathy.     Upper Body:     Right upper body: No supraclavicular adenopathy.     Left upper body: No supraclavicular adenopathy.  Skin:    General: Skin is warm.     Findings: No erythema.  Neurological:     General: No focal deficit present.     Mental Status: He is alert and oriented to person, place, and time.     Cranial Nerves: No cranial nerve deficit.     Sensory: No sensory deficit.     Gait: Gait normal.     Deep Tendon Reflexes:     Reflex Scores:      Bicep reflexes are 2+ on the right side and 2+ on the left side.      Patellar reflexes are 2+ on the right side and 2+ on the left side. Psychiatric:        Mood and Affect: Mood and affect normal.   ASSESSMENT AND PLAN:  There are no diagnoses linked to this encounter. No orders of the defined types were placed in this encounter.   There are no diagnoses linked to this encounter.  No follow-ups on file.  Betty G. Martinique, MD  [facility]. [place] office.

## 2022-04-27 ENCOUNTER — Encounter: Payer: 59 | Admitting: Family Medicine

## 2022-04-27 DIAGNOSIS — E1165 Type 2 diabetes mellitus with hyperglycemia: Secondary | ICD-10-CM

## 2022-04-27 DIAGNOSIS — Z0000 Encounter for general adult medical examination without abnormal findings: Secondary | ICD-10-CM

## 2022-04-27 DIAGNOSIS — E785 Hyperlipidemia, unspecified: Secondary | ICD-10-CM

## 2022-04-29 ENCOUNTER — Other Ambulatory Visit: Payer: Self-pay | Admitting: Internal Medicine

## 2022-04-29 DIAGNOSIS — E1169 Type 2 diabetes mellitus with other specified complication: Secondary | ICD-10-CM

## 2022-07-05 ENCOUNTER — Other Ambulatory Visit (HOSPITAL_COMMUNITY): Payer: Self-pay

## 2022-07-05 ENCOUNTER — Other Ambulatory Visit: Payer: Self-pay

## 2022-07-06 ENCOUNTER — Other Ambulatory Visit: Payer: Self-pay

## 2022-07-16 ENCOUNTER — Other Ambulatory Visit (HOSPITAL_COMMUNITY): Payer: Self-pay

## 2022-07-29 ENCOUNTER — Other Ambulatory Visit (HOSPITAL_COMMUNITY): Payer: Self-pay

## 2022-08-15 IMAGING — CT CT CARDIAC CORONARY ARTERY CALCIUM SCORE
3 series · 14 of 20 positions shown, 15 images · non-contrast
Comparison: None.

Addendum:
CLINICAL DATA: Risk stratification

EXAM:
Coronary Calcium Score
TECHNIQUE: The patient was scanned on a Siemens Somatom 64 slice scanner. Axial
non-contrast 3mm slices were carried out through the heart. The data
set was analyzed on a dedicated work station and scored using the
Agatson method.

[Series 2: casc 3.0 bv41 2 bestsyst 37 % · axial · 0.39mm/px · z∈[-206,-140]mm · 4 of 38 slices shown, 5 images]
[im 8/38  vessel]
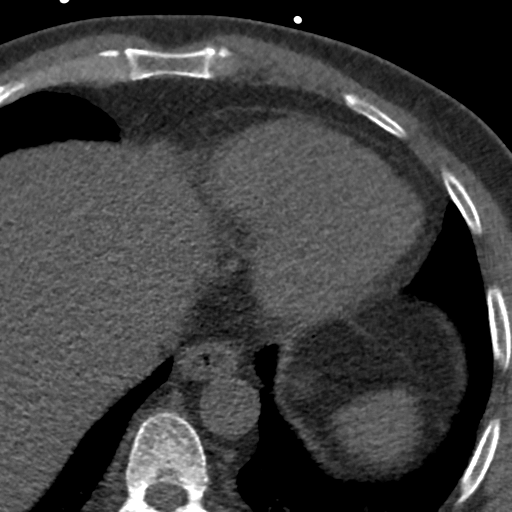
[im 8/38  lung]
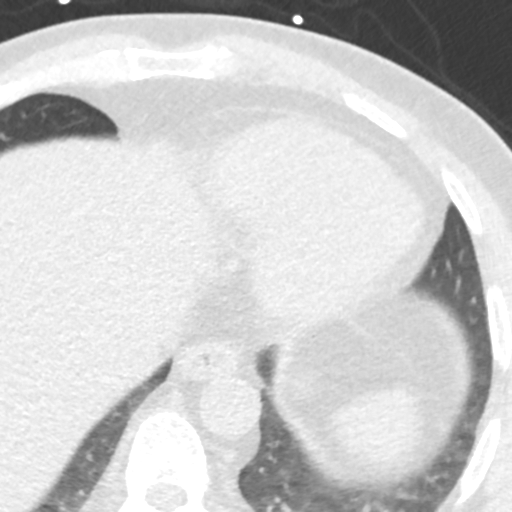
[im 15/38  vessel]
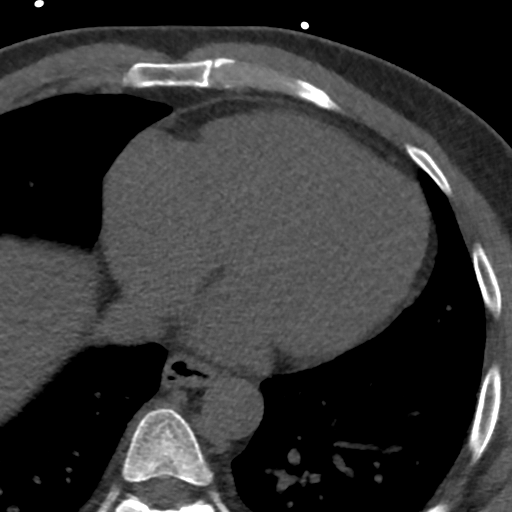
[im 23/38  vessel]
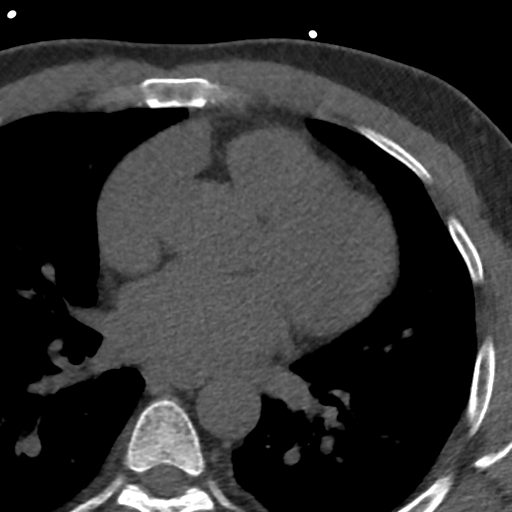
[im 30/38  vessel]
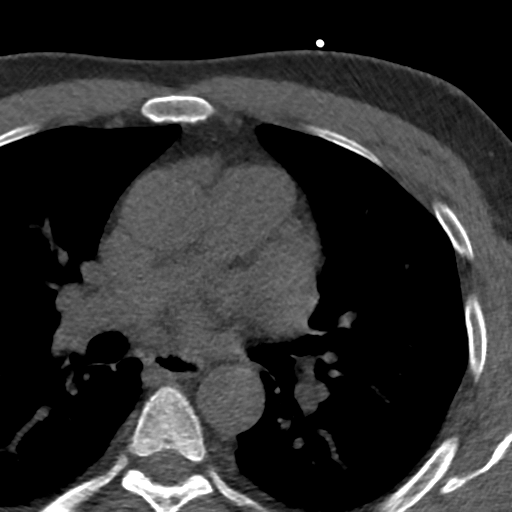

[Series 3: lung 35 % · axial · 0.68mm/px · z∈[-208,-136]mm · 5 of 38 slices shown]
[im 7/38  lung]
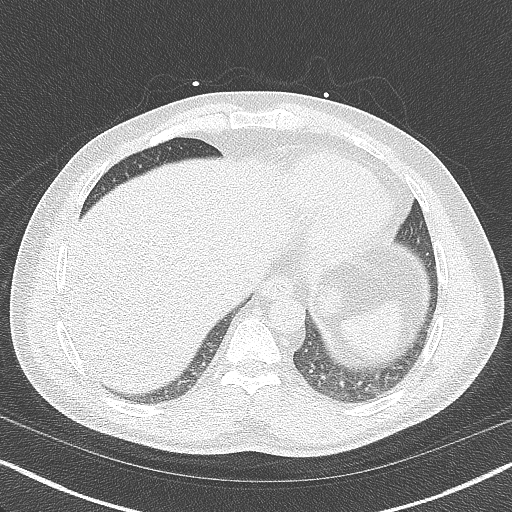
[im 13/38  lung]
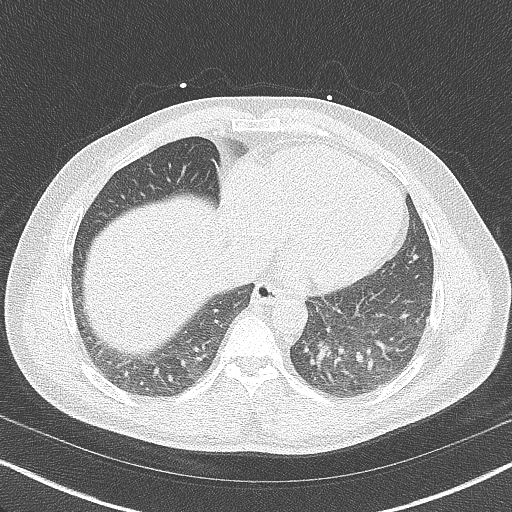
[im 19/38  lung]
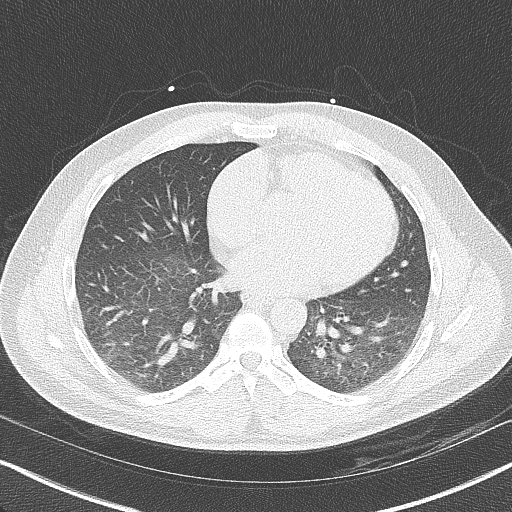
[im 25/38  lung]
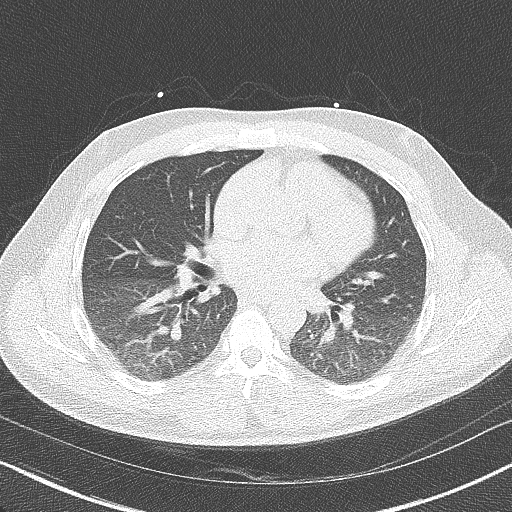
[im 31/38  lung]
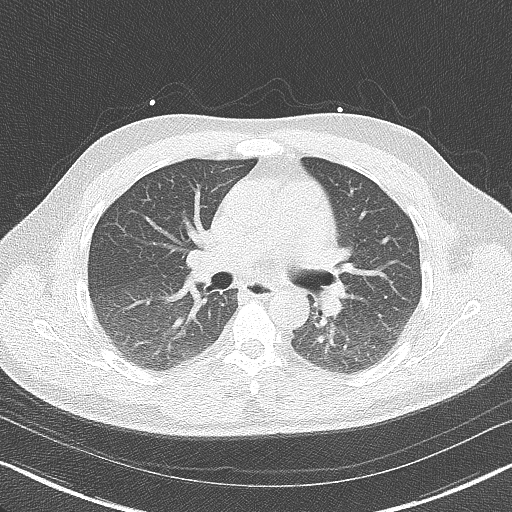

[Series 4: lung st 35 % · axial · 0.64mm/px · z∈[-208,-136]mm · 5 of 38 slices shown]
[im 7/38  lung]
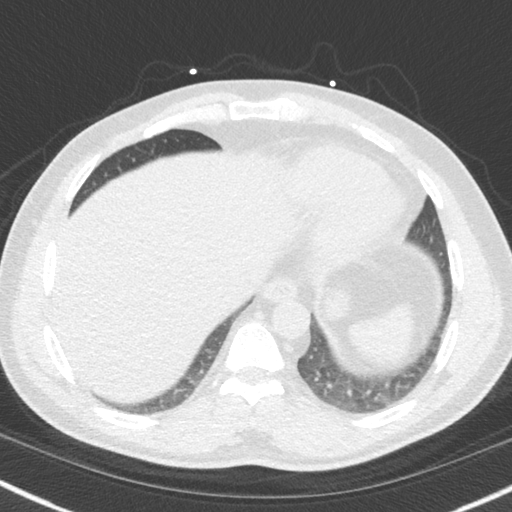
[im 13/38  lung]
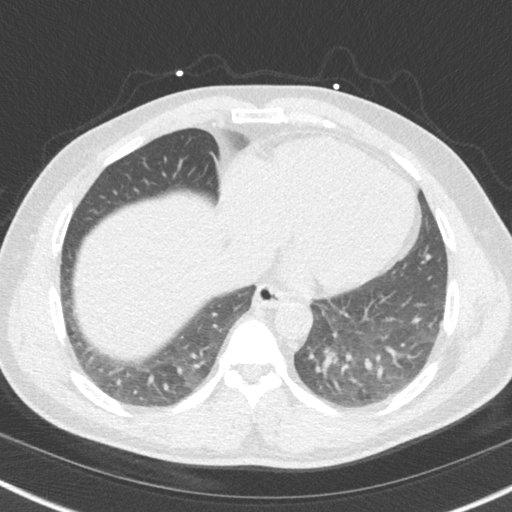
[im 19/38  lung]
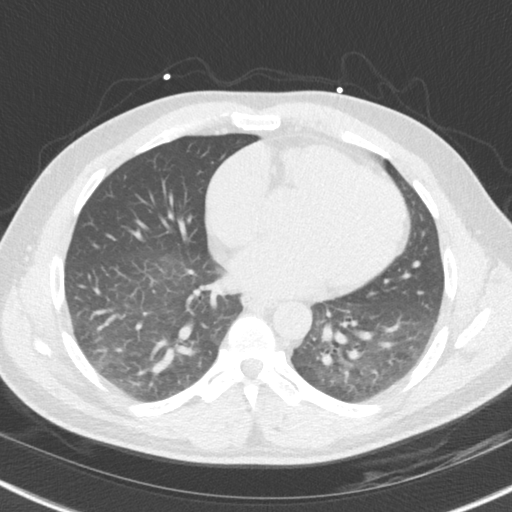
[im 25/38  lung]
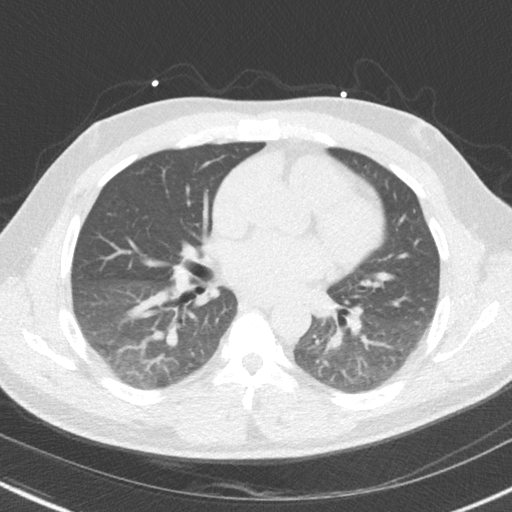
[im 31/38  lung]
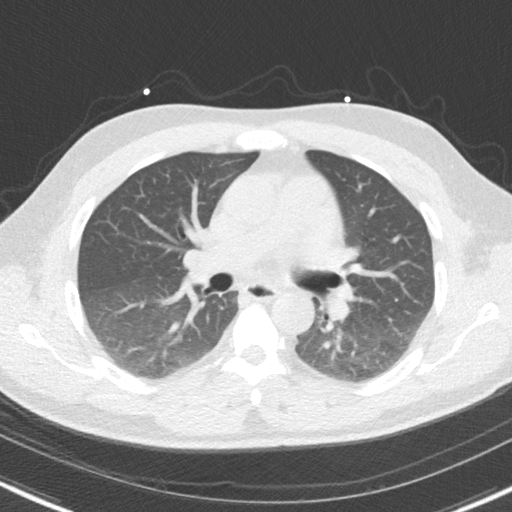

[14 of 20 positions shown; findings below may reference images not displayed]

FINDINGS: Non-cardiac: No significant non cardiac findings on limited lung and
soft tissue windows. See separate report from [REDACTED].

Ascending Aorta: Normal diameter 3.0 cm

Pericardium: Normal

Coronary arteries: No calcium noted
IMPRESSION: Coronary calcium score of 0.

Shelita Os

EXAM:
OVER-READ INTERPRETATION  CT CHEST

The following report is an over-read performed by radiologist Dr.
over-read does not include interpretation of cardiac or coronary
anatomy or pathology. The coronary calcium score interpretation by
the cardiologist is attached.
FINDINGS: Within the visualized portions of the thorax there are no suspicious
appearing pulmonary nodules or masses, there is no acute
consolidative airspace disease, no pleural effusions, no
pneumothorax and no lymphadenopathy. Visualized portions of the
upper abdomen are unremarkable. There are no aggressive appearing
lytic or blastic lesions noted in the visualized portions of the
skeleton.
IMPRESSION: 1. No significant incidental noncardiac findings are noted.

*** End of Addendum ***
FINDINGS: Non-cardiac: No significant non cardiac findings on limited lung and
soft tissue windows. See separate report from [REDACTED].

Ascending Aorta: Normal diameter 3.0 cm

Pericardium: Normal

Coronary arteries: No calcium noted
IMPRESSION: Coronary calcium score of 0.

Shelita Os

## 2023-02-28 ENCOUNTER — Other Ambulatory Visit: Payer: Self-pay | Admitting: Family Medicine

## 2023-03-04 NOTE — Telephone Encounter (Signed)
 Spoke to pt.   Pt states to not send it in yet as he needs to update his insurance. His insurance is not active. Inform pt we can schedule an appt for his cpe and he can bring in his insurance card. Pt states he will give us  a call when he has his insurance.

## 2023-04-18 ENCOUNTER — Ambulatory Visit (HOSPITAL_COMMUNITY): Payer: Self-pay

## 2023-05-20 ENCOUNTER — Ambulatory Visit (HOSPITAL_COMMUNITY): Payer: Self-pay

## 2023-05-21 ENCOUNTER — Ambulatory Visit (HOSPITAL_COMMUNITY)

## 2023-06-03 ENCOUNTER — Ambulatory Visit: Admitting: Internal Medicine

## 2023-06-03 ENCOUNTER — Other Ambulatory Visit: Payer: Self-pay | Admitting: Internal Medicine

## 2023-06-03 ENCOUNTER — Encounter: Payer: Self-pay | Admitting: Internal Medicine

## 2023-06-03 VITALS — BP 136/88 | HR 88 | Ht 68.0 in | Wt 152.0 lb

## 2023-06-03 DIAGNOSIS — E1169 Type 2 diabetes mellitus with other specified complication: Secondary | ICD-10-CM | POA: Diagnosis not present

## 2023-06-03 DIAGNOSIS — R61 Generalized hyperhidrosis: Secondary | ICD-10-CM

## 2023-06-03 DIAGNOSIS — R7989 Other specified abnormal findings of blood chemistry: Secondary | ICD-10-CM

## 2023-06-03 DIAGNOSIS — G909 Disorder of the autonomic nervous system, unspecified: Secondary | ICD-10-CM

## 2023-06-03 DIAGNOSIS — Z794 Long term (current) use of insulin: Secondary | ICD-10-CM

## 2023-06-03 DIAGNOSIS — Z8489 Family history of other specified conditions: Secondary | ICD-10-CM | POA: Diagnosis not present

## 2023-06-03 DIAGNOSIS — E1165 Type 2 diabetes mellitus with hyperglycemia: Secondary | ICD-10-CM | POA: Diagnosis not present

## 2023-06-03 LAB — POCT GLUCOSE (DEVICE FOR HOME USE): POC Glucose: 318 mg/dL — AB (ref 70–99)

## 2023-06-03 LAB — POCT GLYCOSYLATED HEMOGLOBIN (HGB A1C): Hemoglobin A1C: 11.7 % — AB (ref 4.0–5.6)

## 2023-06-03 MED ORDER — FREESTYLE LIBRE 3 PLUS SENSOR MISC
1.0000 | 3 refills | Status: DC
Start: 2023-06-03 — End: 2023-11-18

## 2023-06-03 MED ORDER — NOVOLOG FLEXPEN 100 UNIT/ML ~~LOC~~ SOPN: 14.0000 [IU] | PEN_INJECTOR | Freq: Three times a day (TID) | SUBCUTANEOUS | 2 refills | Status: DC

## 2023-06-03 MED ORDER — DAPAGLIFLOZIN PROPANEDIOL 10 MG PO TABS
10.0000 mg | ORAL_TABLET | Freq: Every day | ORAL | 2 refills | Status: DC
Start: 2023-06-03 — End: 2023-11-18

## 2023-06-03 MED ORDER — LANTUS SOLOSTAR 100 UNIT/ML ~~LOC~~ SOPN: 20.0000 [IU] | PEN_INJECTOR | Freq: Every day | SUBCUTANEOUS | 6 refills | Status: DC

## 2023-06-03 MED ORDER — INSULIN PEN NEEDLE 32G X 4 MM MISC: 1.0000 | Freq: Four times a day (QID) | 2 refills | Status: DC

## 2023-06-03 MED ORDER — METFORMIN HCL ER 500 MG PO TB24: 1500.0000 mg | ORAL_TABLET | Freq: Every day | ORAL | 2 refills | Status: DC

## 2023-06-03 NOTE — Progress Notes (Signed)
 Name: Rodney Pennington  Age/ Sex: 48 y.o., male   MRN/ DOB: [id number], 09-May-1975     PCP: [place], Betty G, MD   Reason for Endocrinology Evaluation: Type 2 Diabetes Mellitus  Initial Endocrine Consultative Visit: 08/03/2019    PATIENT IDENTIFIER: Rodney Pennington is a 48 y.o. male with a past medical history of  T2DM, CKD, Hx of pancreatitis due to gallstones (S/P ERCP) , renal stones , fatty liver  and dyslipidemia .Marland Kitchen The patient has followed with Endocrinology clinic since 08/03/2019 for consultative assistance with management of his diabetes.  DIABETIC HISTORY:  Rodney Pennington was diagnosed with DM in 2013. He was on Metformin 750 mg ER but he believes this wasn't absorbed so switched to 500 mg tabs and doing well. Januvia switched to Trulicity in 05/2019,he was also on  Dimacron .   His hemoglobin A1c has ranged from 8.0% , peaking at 10.7% %  in 2021   Pt chronic GI issues in the form of cramps, pain and occasional regurgitation     On his initial visit to our clinic his A1c was 10.7% , he was on metformin, trulicity and Metformin , and basal insulin, no changes were made as he was recently started on this regimen.   Trulicity was discontinued due to recurrent history of pancreatitis by 07/2019 Attempted to start SGLT-2 inhibitors in 2022 but recurrent history of medication interruption for variable reasons mainly pharmacy/ insurance issues  Started Regular insulin 11/2020 with an A1c 13.6%    Patient self discontinued statin therapy in 2024 and wanted to focus on lifestyle modifications  SUBJECTIVE:   During the last visit (03/05/2022): This was a virtual visit     Today (06/03/2023): Rodney Pennington is here for a follow up on diabetes management.  He has NOT been to our clinic in 15 months. He  has not been checking glucose at home    He has not been on [place] and Metformin as he has been out of it  He self discontinued atorvastatin and has been working on dietary  modifications  He was seen by cardiology in 2022 for palpitations, echo was performed showing low-normal EF.  Was prescribed Metoprolol but has ran out of it.  He Continues with excessive sweating and continues with palpitations as well as tremors   Denies abdominal pain or vomiting  No changes in bowel movements  Has FH of pheochromocytoma    HOME DIABETES REGIMEN:  - Lantus 20 units daily - Metformin 500 mg, 3 tabs daily - not taking  - Farxiga 10 mg daily- not taking  - Humalog 6-8 units BID- takes 14 units BID   - CF: Novolin-R (BG-130/35)   Statin: Yes ACE-I/ARB: no    METER DOWNLOAD SUMMARY: Did not bring     DIABETIC COMPLICATIONS: Microvascular complications:   Denies: neuropathy, retinopathy and CKD Last Eye Exam: Completed 2021  Macrovascular complications:   Denies: CAD, CVA, PVD   HISTORY:  Past Medical History:  Past Medical History:  Diagnosis Date   Diabetes mellitus without complication ([facility])    GERD (gastroesophageal reflux disease)    History of chicken pox    Hx of eating disorder    Hyperlipidemia    Past Surgical History:  Past Surgical History:  Procedure Laterality Date   ERCP  2017   calcular cholecystitis   Social History:  reports that he has never smoked. He has never used smokeless tobacco. He reports that he does not drink  alcohol and does not use drugs. Family History:  Family History  Problem Relation Age of Onset   Arthritis Mother    Heart attack Mother    Hyperlipidemia Mother    Kidney disease Mother    Arthritis Father    Arthritis Sister    Hypertension Paternal Uncle    Hearing loss Sister    Hypertension Sister    Kidney disease Sister    Diabetes Brother      HOME MEDICATIONS: Allergies as of 06/03/2023   No Known Allergies      Medication List        Accurate as of June 03, 2023  1:12 PM. If you have any questions, ask your nurse or doctor.          STOP taking these medications     amoxicillin 500 MG capsule Commonly known as: AMOXIL Stopped by: Johnney Ou Bambi Fehnel   metFORMIN 500 MG tablet Commonly known as: GLUCOPHAGE Replaced by: metFORMIN 500 MG 24 hr tablet Stopped by: Johnney Ou Bobbiejo Ishikawa   NovoLOG FlexPen 100 UNIT/ML FlexPen Generic drug: insulin aspart Replaced by: insulin lispro 100 UNIT/ML injection Stopped by: Johnney Ou Nyleah Mcginnis       TAKE these medications    Accu-Chek Guide Me w/Device Kit Use to test blood sugar 3 times daily   Accu-Chek Guide test strip Generic drug: glucose blood Use to test blood sugar 3 times daily.   Accu-Chek Softclix Lancets lancets Use to test blood sugar 3 times daily.   atorvastatin 40 MG tablet Commonly known as: LIPITOR Take 1 tablet (40 mg total) by mouth daily.   Betamethasone Valerate 0.12 % foam Apply 1 application  topically daily as needed.   blood glucose meter kit and supplies Use to test blood sugar 3 times daily.   cyclobenzaprine 5 MG tablet Commonly known as: FLEXERIL Take by mouth.   dapagliflozin propanediol 10 MG Tabs tablet Commonly known as: Farxiga Take 1 tablet (10 mg total) by mouth daily before breakfast.   fluticasone 50 MCG/ACT nasal spray Commonly known as: FLONASE SHAKE LIQUID AND USE 1 SPRAY IN EACH NOSTRIL TWICE DAILY   FreeStyle Libre 3 Plus Sensor Misc 1 Device by Other route every 14 (fourteen) days. Change sensor every 15 days. Started by: Johnney Ou Idabelle Mcpeters   gabapentin 300 MG capsule Commonly known as: NEURONTIN Take by mouth.   insulin lispro 100 UNIT/ML injection Commonly known as: HUMALOG Inject 0.14 mLs (14 Units total) into the skin 3 (three) times daily with meals. Replaces: NovoLOG FlexPen 100 UNIT/ML FlexPen Started by: Johnney Ou Gemayel Mascio   Insulin Pen Needle 32G X 4 MM Misc Use 1 pen in the morning, at noon, in the evening, and at bedtime.   ketoconazole 2 % shampoo Commonly known as: Nizoral Apply 1 application topically 2 (two)  times a week.   Lantus SoloStar 100 UNIT/ML Solostar Pen Generic drug: insulin glargine Inject 20 Units into the skin daily.   meloxicam 15 MG tablet Commonly known as: MOBIC Take 1 tablet by mouth daily.   metFORMIN 500 MG 24 hr tablet Commonly known as: GLUCOPHAGE-XR Take 3 tablets (1,500 mg total) by mouth daily in the afternoon. Replaces: metFORMIN 500 MG tablet Started by: Johnney Ou Nefi Musich   metoprolol tartrate 25 MG tablet Commonly known as: LOPRESSOR Take 1 tablet (25 mg total) by mouth 2 (two) times daily.   multivitamin tablet Take 1 tablet by mouth daily.   pantoprazole 40 MG tablet Commonly known as: PROTONIX Take  1 tablet (40 mg total) by mouth daily.         OBJECTIVE:   Vital Signs: BP 136/88 (BP Location: Left Arm, Patient Position: Sitting, Cuff Size: Small)   Pulse 88   Ht 5\' 8"  (1.727 m)   Wt 152 lb (68.9 kg)   SpO2 96%   BMI 23.11 kg/m    Wt Readings from Last 3 Encounters:  06/03/23 152 lb (68.9 kg)  08/07/21 171 lb 6 oz (77.7 kg)  04/27/21 177 lb (80.3 kg)     Exam: General: Pt appears well and is in NAD  Heart: RRR  Lungs: CTA  Abdomen: Soft, non tender   LE: No edema   Neuro: MS is good with appropriate affect, pt is alert and Ox3    DM foot exam: 06/03/2023 The skin of the feet is intact without sores or ulcerations. The pedal pulses are 2+ on right and 2+ on left. The sensation is intact to a screening 5.07, 10 gram monofilament bilaterally           DATA REVIEWED:  Lab Results  Component Value Date   HGBA1C 11.7 (A) 06/03/2023   HGBA1C 8.3 (H) 02/12/2022   HGBA1C 8.9 (A) 04/27/2021    Latest Reference Range & Units 06/03/23 09:31  Sodium 135 - 146 mmol/L 134 (L)  Potassium 3.5 - 5.3 mmol/L 4.0  Chloride 98 - 110 mmol/L 100  CO2 20 - 32 mmol/L 26  Glucose 65 - 99 mg/dL 562 (H)  BUN 7 - 25 mg/dL 15  Creatinine 1.30 - 8.65 mg/dL 7.84  Calcium 8.6 - 69.6 mg/dL 9.2  BUN/Creatinine Ratio 6 - 22 (calc) SEE  NOTE:  eGFR > OR = 60 mL/min/1.27m2 116    Latest Reference Range & Units 06/03/23 09:31  TSH 0.40 - 4.50 mIU/L 0.61  T4,Free(Direct) 0.8 - 1.8 ng/dL 1.4    Latest Reference Range & Units 06/03/23 09:31  Microalb, Ur mg/dL 1.1  MICROALB/CREAT RATIO <30 mg/g creat 22  Creatinine, Urine 20 - 320 mg/dL 50     ASSESSMENT / PLAN / RECOMMENDATIONS:     1) Type 2 Diabetes Mellitus, Poorly controlled, with improving glycemic control, Without complications - Most recent A1c of 11.7 %. Goal A1c < 7.0 %.    -Poorly controlled diabetes due to medication nonadherence and dietary indiscretions -Patient has not been checking glucose at home, he has new insurance, freestyle Fox sent to check on the price -He has been out of metformin, Farxiga, and Lantus, patient has not been to our clinic in 15 months and had were not of medications -Not a candidate for GLP-1 agonist nor DPP 4 inhibitors due to recurrent pancreatitis -Will restart Lantus, metformin, Farxiga -He was encouraged again to take Humalog 3 times daily with each meal rather than twice daily -In office BG 318 Mg/DL, after breakfast    MEDICATIONS:  -Restart Lantus 20 units daily -Restart metformin 500 mg, three tablets daily  -Recent heart Farxiga 10 mg daily -Continue Humalog 14 units with breakfast, lunch and supper -Correction factor: Humalog (BG -130/35)   EDUCATION / INSTRUCTIONS: BG monitoring instructions: Patient is instructed to check his blood sugars 1 times a day, fasting . Call [facility] if: BG persistently < 70 I reviewed the Rule of 15 for the treatment of hypoglycemia in detail with the patient. Literature supplied.    2) Diabetic complications:  Eye: Does not have known diabetic retinopathy.  Neuro/ Feet: Does not have known diabetic peripheral neuropathy .  Renal: Patient does not have known baseline CKD. He   is not on an ACEI/ARB at present.   3) Palpitations:  -This is chronic, he  was evaluated by cardiology with negative workup -I suspect autonomic dysfunction due to DM -Patient concerned about pheochromocytoma, plus metanephrines will be checked today -TFTs normal   4) Hyperhidrosis:  -Chronic -TFTs in the past normal -Suspect autonomic dysfunction due to DM -Will check testosterone    F/U in 3 months    Signed electronically by: Lyndle Herrlich, MD  [facility] End[facility]  [facility] [address] [place] [place], [place] [address] Phone: [phone] FAX: [phone]   CC: [place], Betty G, MD [address] [place] [address] Phone: [phone]  Fax: [phone]  Return to Endocrinology clinic as below: Future Appointments  Date Time Provider Department Center  09/09/2023  3:00 PM Narya Beavin, Konrad Dolores, MD [id number] None

## 2023-06-03 NOTE — Patient Instructions (Addendum)
 Restart metformin 500 mg 3 tablets daily Restart Farxiga 10 mg daily Restart Lantus 20 units daily Take Novolog 14 units with breakfast, lunch, and dinner    HOW TO TREAT LOW BLOOD SUGARS (Blood sugar LESS THAN 70 MG/DL) Please follow the RULE OF 15 for the treatment of hypoglycemia treatment (when your (blood sugars are less than 70 mg/dL)   STEP 1: Take 15 grams of carbohydrates when your blood sugar is low, which includes:  3-4 GLUCOSE TABS  OR 3-4 OZ OF JUICE OR REGULAR SODA OR ONE TUBE OF GLUCOSE GEL    STEP 2: RECHECK blood sugar in 15 MINUTES STEP 3: If your blood sugar is still low at the 15 minute recheck --> then, go back to STEP 1 and treat AGAIN with another 15 grams of carbohydrates.

## 2023-06-06 ENCOUNTER — Encounter: Payer: Self-pay | Admitting: Internal Medicine

## 2023-06-07 LAB — BASIC METABOLIC PANEL WITH GFR
BUN: 15 mg/dL (ref 7–25)
CO2: 26 mmol/L (ref 20–32)
Calcium: 9.2 mg/dL (ref 8.6–10.3)
Chloride: 100 mmol/L (ref 98–110)
Creat: 0.65 mg/dL (ref 0.60–1.29)
Glucose, Bld: 305 mg/dL — ABNORMAL HIGH (ref 65–99)
Potassium: 4 mmol/L (ref 3.5–5.3)
Sodium: 134 mmol/L — ABNORMAL LOW (ref 135–146)
eGFR: 116 mL/min/{1.73_m2} (ref >=60)

## 2023-06-07 LAB — MICROALBUMIN / CREATININE URINE RATIO
Creatinine, Urine: 50 mg/dL (ref 20–320)
Microalb Creat Ratio: 22 mg/g{creat} (ref <30)
Microalb, Ur: 1.1 mg/dL

## 2023-06-07 LAB — T4, FREE: Free T4: 1.4 ng/dL (ref 0.8–1.8)

## 2023-06-07 LAB — METANEPHRINES, PLASMA
Metanephrine, Free: 47 pg/mL (ref <=57)
Normetanephrine, Free: 42 pg/mL (ref <=148)
Total Metanephrines-Plasma: 89 pg/mL (ref <=205)

## 2023-06-07 LAB — TESTOSTERONE, TOTAL, LC/MS/MS: Testosterone, Total, LC-MS-MS: 118 ng/dL — ABNORMAL LOW (ref 250–1100)

## 2023-06-07 LAB — TSH: TSH: 0.61 m[IU]/L (ref 0.40–4.50)

## 2023-06-08 ENCOUNTER — Telehealth: Payer: Self-pay | Admitting: Internal Medicine

## 2023-06-08 NOTE — Telephone Encounter (Signed)
 Please let the patient know that his testosterone level is very low and.  The guidelines we do need to repeat testosterone fasting around 8 AM in addition to other hormones.    When I saw him during the visit he said he has to work, but unfortunately we cannot do this late in the day and has to be around 8 AM fasting   He can look at his schedule and call us and see when he can be here 8 AM for labs   Thanks

## 2023-06-08 NOTE — Addendum Note (Signed)
 Addended by: Scarlette Shorts on: 06/08/2023 02:52 PM   Modules accepted: Orders

## 2023-06-08 NOTE — Telephone Encounter (Signed)
 Patient notified and will call us to back to schedule appointment for fasting lab at 8am.

## 2023-06-24 ENCOUNTER — Other Ambulatory Visit

## 2023-06-27 LAB — PROLACTIN: Prolactin: 4.8 ng/mL (ref 2.0–18.0)

## 2023-06-27 LAB — TESTOSTERONE, TOTAL, LC/MS/MS: Testosterone, Total, LC-MS-MS: 177 ng/dL — ABNORMAL LOW (ref 250–1100)

## 2023-06-27 LAB — LUTEINIZING HORMONE: LH: 3.1 m[IU]/mL (ref 1.5–9.3)

## 2023-06-30 ENCOUNTER — Other Ambulatory Visit (HOSPITAL_COMMUNITY): Payer: Self-pay

## 2023-06-30 ENCOUNTER — Telehealth: Payer: Self-pay | Admitting: Internal Medicine

## 2023-06-30 DIAGNOSIS — E230 Hypopituitarism: Secondary | ICD-10-CM

## 2023-06-30 DIAGNOSIS — E1169 Type 2 diabetes mellitus with other specified complication: Secondary | ICD-10-CM

## 2023-06-30 MED ORDER — CLOMIPHENE CITRATE 50 MG PO TABS
50.0000 mg | ORAL_TABLET | ORAL | 2 refills | Status: AC
  Filled 2023-06-30: qty 10, 30d supply, fill #0

## 2023-06-30 MED ORDER — INSULIN LISPRO 100 UNIT/ML IJ SOLN
14.0000 [IU] | Freq: Three times a day (TID) | INTRAMUSCULAR | 2 refills | Status: AC
Start: 2023-06-30 — End: ?
  Filled 2023-06-30: qty 45, 107d supply, fill #0
  Filled 2023-11-07: qty 10, 24d supply, fill #0

## 2023-06-30 NOTE — Telephone Encounter (Signed)
 Spoke to the patient on 06/30/2023 at 1315   Discussed low testosterone  level x 2 with inappropriately normal LH   We discussed alternative therapy to include testosterone  therapy versus clomiphene , patient opted for clomiphene    Will proceed with MRI for pituitary imaging  Patient denies erectile dysfunction, he has biological children, last child was born 04/2022   Patient does endorse occasional right testicular pain but no other symptoms except for chronic hyperhidrosis   Clomiphene  50 mg, 1 tablet 4 times a week was sent to the pharmacy

## 2023-07-01 ENCOUNTER — Other Ambulatory Visit (HOSPITAL_COMMUNITY): Payer: Self-pay

## 2023-07-01 ENCOUNTER — Telehealth: Payer: Self-pay

## 2023-07-01 NOTE — Telephone Encounter (Signed)
 Pharmacy Patient Advocate Encounter   Received notification from CoverMyMeds that prior authorization for Clomid  is required/requested.   Insurance verification completed.   The patient is insured through [facility] .   Per test claim: PA required; PA submitted to above mentioned insurance via CoverMyMeds Key/confirmation #/EOC [id number] Status is pending

## 2023-07-04 ENCOUNTER — Other Ambulatory Visit (HOSPITAL_COMMUNITY): Payer: Self-pay

## 2023-07-04 ENCOUNTER — Encounter (HOSPITAL_COMMUNITY): Payer: Self-pay

## 2023-07-05 NOTE — Telephone Encounter (Signed)
 Pharmacy Patient Advocate Encounter  Received notification from [facility] that Prior Authorization for Clomid  has been DENIED.  Full denial letter will be uploaded to the media tab. See denial reason below.

## 2023-09-09 ENCOUNTER — Ambulatory Visit: Admitting: Internal Medicine

## 2023-10-21 ENCOUNTER — Ambulatory Visit: Admitting: Internal Medicine

## 2023-10-25 ENCOUNTER — Telehealth: Payer: Self-pay | Admitting: Family Medicine

## 2023-10-25 NOTE — Telephone Encounter (Signed)
 Patient dropped off Health Examination form to be filled out. Stated he needs it back in 1 or 2 days due to insurance. Advised patient it could take 5-7 business days

## 2023-11-07 ENCOUNTER — Other Ambulatory Visit: Payer: Self-pay

## 2023-11-07 ENCOUNTER — Other Ambulatory Visit (HOSPITAL_COMMUNITY): Payer: Self-pay

## 2023-11-07 ENCOUNTER — Ambulatory Visit: Admitting: Family Medicine

## 2023-11-07 ENCOUNTER — Encounter: Payer: Self-pay | Admitting: Family Medicine

## 2023-11-07 VITALS — BP 120/74 | HR 87 | Temp 98.1°F | Resp 12 | Ht 67.13 in | Wt 167.2 lb

## 2023-11-07 DIAGNOSIS — Z1211 Encounter for screening for malignant neoplasm of colon: Secondary | ICD-10-CM

## 2023-11-07 DIAGNOSIS — L219 Seborrheic dermatitis, unspecified: Secondary | ICD-10-CM

## 2023-11-07 DIAGNOSIS — Z0000 Encounter for general adult medical examination without abnormal findings: Secondary | ICD-10-CM | POA: Diagnosis not present

## 2023-11-07 DIAGNOSIS — E785 Hyperlipidemia, unspecified: Secondary | ICD-10-CM

## 2023-11-07 DIAGNOSIS — E1169 Type 2 diabetes mellitus with other specified complication: Secondary | ICD-10-CM

## 2023-11-07 DIAGNOSIS — K219 Gastro-esophageal reflux disease without esophagitis: Secondary | ICD-10-CM

## 2023-11-07 LAB — LIPID PANEL
Cholesterol: 243 mg/dL — ABNORMAL HIGH (ref 0–200)
HDL: 63.4 mg/dL (ref >39.00)
LDL Cholesterol: 141 mg/dL — ABNORMAL HIGH (ref 0–99)
NonHDL: 179.97
Total CHOL/HDL Ratio: 4
Triglycerides: 197 mg/dL — ABNORMAL HIGH (ref 0.0–149.0)
VLDL: 39.4 mg/dL (ref 0.0–40.0)

## 2023-11-07 MED ORDER — KETOCONAZOLE 2 % EX SHAM: 1.0000 | MEDICATED_SHAMPOO | CUTANEOUS | 3 refills | Status: AC

## 2023-11-07 MED ORDER — PANTOPRAZOLE SODIUM 40 MG PO TBEC
40.0000 mg | DELAYED_RELEASE_TABLET | Freq: Every day | ORAL | 2 refills | Status: AC
Start: 2023-11-07 — End: ?

## 2023-11-07 MED ORDER — HYDROCORTISONE 1 % EX LOTN: 1.0000 | TOPICAL_LOTION | Freq: Two times a day (BID) | CUTANEOUS | 1 refills | Status: AC | PRN

## 2023-11-07 NOTE — Progress Notes (Unsigned)
 Chief Complaint  Patient presents with   Annual Exam   Discussed the use of AI scribe software for clinical note transcription with the patient, who gave verbal consent to proceed.  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 Last CPE 03/24/21.  He has been under the care of an endocrinologist for DM II management since his last visit.   He engages in regular exercise, including walking and running some two to three times per week, and maintains a home-cooked diet, avoiding junk food.  He sleeps seven to eight hours per night.  He does not consume alcohol and has never smoked.  He has not had a colon cancer screening appointment due to issues with the center, despite a previous referral.  Immunization History  Administered Date(s) Administered   Janssen (J&J) SARS-COV-2 Vaccination 05/28/2019, 05/28/2019   Pneumococcal Polysaccharide-23 03/10/2020   Tdap 03/10/2020    Health Maintenance  Topic Date Due   OPHTHALMOLOGY EXAM  Never done   HIV Screening  Never done   COVID-19 Vaccine (3 - 2025-26 season) 11/23/2023 (Originally 10/31/2023)   Colonoscopy  01/07/2024 (Originally 03/01/2020)   Influenza Vaccine  05/29/2024 (Originally 09/30/2023)   Pneumococcal Vaccine (2 of 2 - PCV) 11/06/2024 (Originally 03/10/2021)   Hepatitis B Vaccines 19-59 Average Risk (1 of 3 - 19+ 3-dose series) 11/06/2024 (Originally [birth year]/01/1994)   HEMOGLOBIN A1C  12/03/2023   Diabetic kidney evaluation - eGFR measurement  06/02/2024   Diabetic kidney evaluation - Urine ACR  06/02/2024   FOOT EXAM  06/02/2024   DTaP/Tdap/Td (2 - Td or Tdap) 03/10/2030   Hepatitis C Screening  Completed   HPV VACCINES  Aged Out   Meningococcal B Vaccine  Aged Out   HLD on Atorvastatin  40 mg daily. Lab Results  Component Value Date   CHOL 197 02/12/2022   HDL 52.90 02/12/2022   LDLCALC 118 (H)  02/12/2022   LDLDIRECT 126.0 03/24/2021   TRIG 134.0 02/12/2022   CHOLHDL 4 02/12/2022   -GERD: Currently on Pantoprazole  40 mg daily. While taking medication he doe snot have heartburn.  -He is concerned about scalp and beard pruritus and desquamation, which he describes as worsening. He previously used a prescribed shampoo that provided temporary relief but did not resolve the issue. He has not tried topical steroid. Used Nizoral  shampoo, ran out a few months ago.  Review of Systems  Constitutional:  Negative for activity change, appetite change and fever.  HENT:  Negative for nosebleeds, sore throat and trouble swallowing.   Eyes:  Negative for redness and visual disturbance.  Respiratory:  Negative for cough, shortness of breath and wheezing.   Cardiovascular:  Negative for chest pain, palpitations and leg swelling.  Gastrointestinal:  Negative for abdominal pain, blood in stool, nausea and vomiting.  Endocrine: Negative for cold intolerance, heat intolerance, polydipsia, polyphagia and polyuria.  Genitourinary:  Negative for decreased urine volume, dysuria, genital sores, hematuria and testicular pain.  Musculoskeletal:  Negative for arthralgias, back pain, joint swelling and myalgias.  Skin:  Negative for color change and rash.  Neurological:  Negative for syncope, weakness and headaches.  Hematological:  Negative for adenopathy. Does not bruise/bleed easily.  Psychiatric/Behavioral:  Negative for confusion. The patient is not nervous/anxious.    Current Outpatient Medications on File Prior to Visit  Medication Sig Dispense Refill   Accu-Chek Softclix Lancets lancets Use to test  blood sugar 3 times daily. 100 each 12   atorvastatin  (LIPITOR) 40 MG tablet Take 1 tablet (40 mg total) by mouth daily. 90 tablet 3   blood glucose meter kit and supplies Use to test blood sugar 3 times daily. 1 each 0   Blood Glucose Monitoring Suppl (ACCU-CHEK GUIDE ME) w/Device KIT Use to test blood  sugar 3 times daily 1 kit 0   dapagliflozin  propanediol (FARXIGA ) 10 MG TABS tablet Take 1 tablet (10 mg total) by mouth daily before breakfast. 90 tablet 2   glucose blood (ACCU-CHEK GUIDE) test strip Use to test blood sugar 3 times daily. 100 each 12   insulin  lispro (HUMALOG ) 100 UNIT/ML injection Inject 14 Units into the skin 3 (three) times daily with meals. 45 mL 2   Insulin  Pen Needle 32G X 4 MM MISC Use 1 pen in the morning, at noon, in the evening, and at bedtime. 400 each 2   metFORMIN  (GLUCOPHAGE -XR) 500 MG 24 hr tablet Take 3 tablets (1,500 mg total) by mouth daily in the afternoon. 270 tablet 2   Multiple Vitamin (MULTIVITAMIN) tablet Take 1 tablet by mouth daily. 30 tablet 3   Betamethasone  Valerate 0.12 % foam Apply 1 application  topically daily as needed. (Patient not taking: Reported on 11/07/2023) 100 g 3   clomiPHENE  (CLOMID ) 50 MG tablet Take 1 tablet (50 mg total) by mouth 4 days a week as directed (Patient not taking: Reported on 11/07/2023) 50 tablet 2   Continuous Glucose Sensor (FREESTYLE LIBRE 3 PLUS SENSOR) MISC 1 Device by Other route every 14 (fourteen) days. Change sensor every 15 days. (Patient not taking: Reported on 11/07/2023) 6 each 3   cyclobenzaprine (FLEXERIL) 5 MG tablet Take by mouth. (Patient not taking: Reported on 06/03/2023)     fluticasone  (FLONASE ) 50 MCG/ACT nasal spray SHAKE LIQUID AND USE 1 SPRAY IN EACH NOSTRIL TWICE DAILY (Patient not taking: Reported on 11/07/2023) 48 g 0   gabapentin (NEURONTIN) 300 MG capsule Take by mouth. (Patient not taking: Reported on 11/07/2023)     insulin  glargine (LANTUS  SOLOSTAR) 100 UNIT/ML Solostar Pen Inject 20 Units into the skin daily. (Patient not taking: Reported on 11/07/2023) 15 mL 6   meloxicam (MOBIC) 15 MG tablet Take 1 tablet by mouth daily. (Patient not taking: Reported on 11/07/2023)     metoprolol  tartrate (LOPRESSOR ) 25 MG tablet Take 1 tablet (25 mg total) by mouth 2 (two) times daily. (Patient not taking: Reported on  11/07/2023) 180 tablet 3   pantoprazole  (PROTONIX ) 40 MG tablet Take 1 tablet (40 mg total) by mouth daily. (Patient not taking: Reported on 11/07/2023) 90 tablet 1   No current facility-administered medications on file prior to visit.   Past Medical History:  Diagnosis Date   Diabetes mellitus without complication (HCC)    GERD (gastroesophageal reflux disease)    History of chicken pox    Hx of eating disorder    Hyperlipidemia    Past Surgical History:  Procedure Laterality Date   ERCP  2017   calcular cholecystitis   No Known Allergies  Family History  Problem Relation Age of Onset   Arthritis Mother    Heart attack Mother    Hyperlipidemia Mother    Kidney disease Mother    Arthritis Father    Arthritis Sister    Hypertension Paternal Uncle    Hearing loss Sister    Hypertension Sister    Kidney disease Sister    Diabetes Brother  Social History   Socioeconomic History   Marital status: Married    Spouse name: Not on file   Number of children: Not on file   Years of education: Not on file   Highest education level: Not on file  Occupational History   Not on file  Tobacco Use   Smoking status: Never   Smokeless tobacco: Never  Vaping Use   Vaping status: Never Used  Substance and Sexual Activity   Alcohol use: No   Drug use: Never   Sexual activity: Yes  Other Topics Concern   Not on file  Social History Narrative   Not on file   Social Drivers of Health   Financial Resource Strain: Not on file  Food Insecurity: Not on file  Transportation Needs: Not on file  Physical Activity: Not on file  Stress: Not on file  Social Connections: Not on file   Vitals:   11/07/23 1316  BP: 120/74  Pulse: 87  Resp: 12  Temp: 98.1 F (36.7 C)  SpO2: 98%   Body mass index is 26.09 kg/m.  Wt Readings from Last 3 Encounters:  11/07/23 167 lb 3.2 oz (75.8 kg)  06/03/23 152 lb (68.9 kg)  08/07/21 171 lb 6 oz (77.7 kg)    Physical Exam Vitals and  nursing note reviewed.  Constitutional:      General: He is not in acute distress.    Appearance: He is well-developed.  HENT:     Head: Normocephalic and atraumatic.     Right Ear: Tympanic membrane, ear canal and external ear normal.     Left Ear: Tympanic membrane, ear canal and external ear normal.     Mouth/Throat:     Mouth: Mucous membranes are moist.     Pharynx: Oropharynx is clear.     Comments: Postnasal drainage. Eyes:     Conjunctiva/sclera: Conjunctivae normal.     Pupils: Pupils are equal, round, and reactive to light.  Neck:     Thyroid : No thyromegaly.     Trachea: No tracheal deviation.  Cardiovascular:     Rate and Rhythm: Normal rate and regular rhythm.     Pulses:          Dorsalis pedis pulses are 2+ on the right side and 2+ on the left side.     Heart sounds: No murmur heard. Pulmonary:     Effort: Pulmonary effort is normal. No respiratory distress.     Breath sounds: Normal breath sounds.  Abdominal:     Palpations: Abdomen is soft. There is no hepatomegaly or mass.     Tenderness: There is no abdominal tenderness.  Genitourinary:    Comments: No concerns. Musculoskeletal:        General: No tenderness.     Cervical back: Normal range of motion.     Comments: No major deformities appreciated and no signs of synovitis.  Lymphadenopathy:     Cervical: No cervical adenopathy.     Upper Body:     Right upper body: No supraclavicular adenopathy.     Left upper body: No supraclavicular adenopathy.  Skin:    General: Skin is warm.     Findings: No erythema or rash.  Neurological:     Mental Status: He is alert and oriented to person, place, and time.     Cranial Nerves: No cranial nerve deficit.     Sensory: No sensory deficit.     Coordination: Coordination normal.     Gait: Gait normal.  Deep Tendon Reflexes:     Reflex Scores:      Bicep reflexes are 2+ on the right side and 2+ on the left side.      Patellar reflexes are 2+ on the right  side and 2+ on the left side.   ASSESSMENT AND PLAN:  Kinnie was seen today for annual exam.  Diagnoses and all orders for this visit:  Orders Placed This Encounter  Procedures   Lipid panel   Ambulatory referral to Gastroenterology   Ambulatory referral to Dermatology   Lab Results  Component Value Date   CHOL 243 (H) 11/07/2023   HDL 63.40 11/07/2023   LDLCALC 141 (H) 11/07/2023   LDLDIRECT 126.0 03/24/2021   TRIG 197.0 (H) 11/07/2023   CHOLHDL 4 11/07/2023   Routine general medical examination at a health care facility Assessment & Plan: We discussed the importance of regular physical activity and healthy diet for prevention of chronic illness and/or complications. Preventive guidelines reviewed. Vaccination: Declined flu and Prevnar 20.  Employment health examination form completed. Next CPE in a year.  Seborrheic dermatitis of scalp Seborrheic dermatitis involving scalp and beard. I do not appreciate lesions today but he has pictures during flare ups. Chronic condition with itching and sloughing. Reports that Nizoral  shampoo provided partial relief. Differential includes psoriasis.  Placed referral to dermatology. Resume Nizoral  shampoo with three refills. Try topical hydrocortisone  lotion bid prn.  -     Ketoconazole ; Apply 1 Application topically 2 (two) times a week.  Dispense: 120 mL; Refill: 3 -     Ambulatory referral to Dermatology -     Hydrocortisone ; Apply 1 Application topically 2 (two) times daily as needed for itching (scalp dermatitis.).  Dispense: 118 mL; Refill: 1  Type 2 diabetes mellitus with other specified complication, without long-term current use of insulin  (HCC) Assessment & Plan: HgA1C 11 in 05/2023. Follows with endocrinologist.  Dyslipidemia Assessment & Plan: He is on Atorvastatin  40 mg daily, no changes today. Further recommendations according to LP results.  Orders: -     Lipid panel; Future  Gastroesophageal reflux disease  without esophagitis Assessment & Plan: He is on Pantoprazole  40 mg daily, which helps with preventing heartburn. He needs refills, continue PPI before breakfast and GERD precautions.  Orders: -     Pantoprazole  Sodium; Take 1 tablet (40 mg total) by mouth daily before breakfast.  Dispense: 90 tablet; Refill: 2  Colon cancer screening -     Ambulatory referral to Gastroenterology  Return in 1 year (on 11/06/2024) for CPE.  Makayah Pauli G. [place], MD  [facility]. [place] office.

## 2023-11-07 NOTE — Patient Instructions (Addendum)
 A few things to remember from today's visit:  Routine general medical examination at a health care facility  Type 2 diabetes mellitus with other specified complication, without long-term current use of insulin  [facility])  Colon cancer screening - Plan: Ambulatory referral to Gastroenterology  Seborrheic dermatitis of scalp - Plan: ketoconazole  (NIZORAL ) 2 % shampoo, Ambulatory referral to Dermatology, hydrocortisone  1 % lotion  Dyslipidemia - Plan: Lipid panel  Do not use My Chart to request refills or for acute issues that need immediate attention. If you send a my chart message, it may take a few days to be addressed, specially if I am not in the office.  Please be sure medication list is accurate. If a new problem present, please set up appointment sooner than planned today.

## 2023-11-07 NOTE — Assessment & Plan Note (Signed)
We discussed the importance of regular physical activity and healthy diet for prevention of chronic illness and/or complications. Preventive guidelines reviewed. Vaccination: Declined flu and Prevnar 20. Next CPE in a year.

## 2023-11-10 ENCOUNTER — Ambulatory Visit: Payer: Self-pay | Admitting: Family Medicine

## 2023-11-10 DIAGNOSIS — E785 Hyperlipidemia, unspecified: Secondary | ICD-10-CM

## 2023-11-10 NOTE — Assessment & Plan Note (Signed)
 He is on Atorvastatin  40 mg daily, no changes today. Further recommendations according to LP results.

## 2023-11-10 NOTE — Assessment & Plan Note (Signed)
 He is on Pantoprazole  40 mg daily, which helps with preventing heartburn. He needs refills, continue PPI before breakfast and GERD precautions.

## 2023-11-10 NOTE — Assessment & Plan Note (Signed)
 HgA1C 11 in 05/2023. Follows with endocrinologist.

## 2023-11-11 MED ORDER — ATORVASTATIN CALCIUM 80 MG PO TABS: 80.0000 mg | ORAL_TABLET | Freq: Every day | ORAL | 0 refills | Status: AC

## 2023-11-17 ENCOUNTER — Other Ambulatory Visit (HOSPITAL_COMMUNITY): Payer: Self-pay

## 2023-11-18 ENCOUNTER — Ambulatory Visit: Admitting: Internal Medicine

## 2023-11-18 ENCOUNTER — Encounter: Payer: Self-pay | Admitting: Internal Medicine

## 2023-11-18 VITALS — BP 136/84 | HR 88 | Ht 67.13 in | Wt 166.2 lb

## 2023-11-18 DIAGNOSIS — E1165 Type 2 diabetes mellitus with hyperglycemia: Secondary | ICD-10-CM | POA: Diagnosis not present

## 2023-11-18 DIAGNOSIS — E1169 Type 2 diabetes mellitus with other specified complication: Secondary | ICD-10-CM

## 2023-11-18 DIAGNOSIS — E230 Hypopituitarism: Secondary | ICD-10-CM

## 2023-11-18 DIAGNOSIS — Z794 Long term (current) use of insulin: Secondary | ICD-10-CM

## 2023-11-18 LAB — POCT GLUCOSE (DEVICE FOR HOME USE): POC Glucose: 268 mg/dL — AB (ref 70–99)

## 2023-11-18 LAB — POCT GLYCOSYLATED HEMOGLOBIN (HGB A1C): Hemoglobin A1C: 13.1 % — AB (ref 4.0–5.6)

## 2023-11-18 MED ORDER — GLIPIZIDE 5 MG PO TABS: ORAL_TABLET | ORAL | 3 refills | Status: AC

## 2023-11-18 MED ORDER — PIOGLITAZONE HCL 15 MG PO TABS: 15.0000 mg | ORAL_TABLET | Freq: Every day | ORAL | 3 refills | Status: AC

## 2023-11-18 MED ORDER — METFORMIN HCL ER 500 MG PO TB24
1500.0000 mg | ORAL_TABLET | Freq: Every day | ORAL | 2 refills | Status: AC
Start: 2023-11-18 — End: ?

## 2023-11-18 NOTE — Progress Notes (Addendum)
 Name: Rodney Pennington  Age/ Sex: 48 y.o., male   MRN/ DOB: [id number], 1975-09-09     PCP: [place], Betty G, MD   Reason for Endocrinology Evaluation: Type 2 Diabetes Mellitus  Initial Endocrine Consultative Visit: 08/03/2019    PATIENT IDENTIFIER: Rodney Pennington is a 48 y.o. male with a past medical history of  T2DM, CKD, Hx of pancreatitis due to gallstones (S/P ERCP) , renal stones , fatty liver  and dyslipidemia .SABRA The patient has followed with Endocrinology clinic since 08/03/2019 for consultative assistance with management of his diabetes.  DIABETIC HISTORY:  Rodney Pennington was diagnosed with DM in 2013. He was on Metformin  750 mg ER but he believes this wasn't absorbed so switched to 500 mg tabs and doing well. Januvia  switched to Trulicity  in 05/2019,he was also on  Dimacron .   His hemoglobin A1c has ranged from 8.0% , peaking at 10.7% %  in 2021   Pt chronic GI issues in the form of cramps, pain and occasional regurgitation     On his initial visit to our clinic his A1c was 10.7% , he was on metformin , trulicity  and Metformin  , and basal insulin , no changes were made as he was recently started on this regimen.   Trulicity  was discontinued due to recurrent history of pancreatitis by 07/2019 Attempted to start SGLT-2 inhibitors in 2022 but recurrent history of medication interruption for variable reasons mainly pharmacy/ insurance issues    Started Regular insulin  11/2020 with an A1c 13.6%    Due to persistent hyperglycemia and an A1c over 10% due to imperfect intake of insulin , I have discontinued the insulin  and started him on glipizide , and pioglitazone  by September, 2025    HYPOGONADISM HISTORY:  Patient was noted with low total testosterone  of 118 NG/DL in April, 7974, repeat testosterone  at 177 NG/DL  LH was inappropriately normal with normal prolactin  Patient opted to proceed with clomiphene  rather than testosterone  replacement therapy.  Patient was  unable to obtain the clomiphene  without insurance coverage, I did explain to the patient this is an off label use and most likely would not be covered and to check on the price.   Patient was concerned regarding low testosterone  because his wife had just gotten pregnant and had a new baby.  I have sent him to urology for second opinion  Pituitary imaging was ordered but this was not done, something about a letter from the insurance was sent to his old address and the patient did not receive it until the order has expired   SUBJECTIVE:   During the last visit (06/03/2023): A1c 11.7%     Today (11/18/2023): Rodney Pennington is here for a follow up on diabetes management.  Check glucose occasionally.   Pituitary imaging has not been done, the letter for the MRI was sent to his all address and the patient did not receive it in time Patient has noted at times he gets loose stools, and metformin  tablet intact in the stool, he is also complaining of foul-smelling stools.  Patient has asked his PCP for referral to GI Patient with chronic GERD symptoms, these have been improving, he uses PPI as needed  Patient states he has not been on Farxiga  or Lantus  for a while as his pharmacy did not give it to him  In office BG 268 MGs/DL, patient had not eaten since breakfast.  Did not take Humalog  with breakfast, stating it was a small breakfast, breakfast included tea  with sugar and a pastry  HOME DIABETES REGIMEN:  - Metformin  500 mg, 3 tabs daily - Farxiga  10 mg daily- not taking in the past month  -Lantus  20 units daily- not taking  - Humalog  14 units TID  - CF: Humalog   (BG-130/35)     Statin: Yes ACE-I/ARB: no    METER DOWNLOAD SUMMARY: Did not bring     DIABETIC COMPLICATIONS: Microvascular complications:   Denies: neuropathy, retinopathy and CKD Last Eye Exam: Completed 2021  Macrovascular complications:   Denies: CAD, CVA, PVD   HISTORY:  Past Medical History:  Past Medical  History:  Diagnosis Date   Diabetes mellitus without complication (HCC)    GERD (gastroesophageal reflux disease)    History of chicken pox    Hx of eating disorder    Hyperlipidemia    Past Surgical History:  Past Surgical History:  Procedure Laterality Date   ERCP  2017   calcular cholecystitis   Social History:  reports that he has never smoked. He has never used smokeless tobacco. He reports that he does not drink alcohol and does not use drugs. Family History:  Family History  Problem Relation Age of Onset   Arthritis Mother    Heart attack Mother    Hyperlipidemia Mother    Kidney disease Mother    Arthritis Father    Arthritis Sister    Hypertension Paternal Uncle    Hearing loss Sister    Hypertension Sister    Kidney disease Sister    Diabetes Brother      HOME MEDICATIONS: Allergies as of 11/18/2023   No Known Allergies      Medication List        Accurate as of November 18, 2023  3:05 PM. If you have any questions, ask your nurse or doctor.          Accu-Chek Guide Me w/Device Kit Use to test blood sugar 3 times daily   Accu-Chek Guide test strip Generic drug: glucose blood Use to test blood sugar 3 times daily.   Accu-Chek Softclix Lancets lancets Use to test blood sugar 3 times daily.   atorvastatin  40 MG tablet Commonly known as: LIPITOR Take 1 tablet (40 mg total) by mouth daily.   atorvastatin  80 MG tablet Commonly known as: LIPITOR Take 1 tablet (80 mg total) by mouth daily.   Betamethasone  Valerate 0.12 % foam Apply 1 application  topically daily as needed.   blood glucose meter kit and supplies Use to test blood sugar 3 times daily.   clomiPHENE  50 MG tablet Commonly known as: CLOMID  Take 1 tablet (50 mg total) by mouth 4 days a week as directed   cyclobenzaprine 5 MG tablet Commonly known as: FLEXERIL Take by mouth.   dapagliflozin  propanediol 10 MG Tabs tablet Commonly known as: Farxiga  Take 1 tablet (10 mg total)  by mouth daily before breakfast.   FreeStyle Libre 3 Plus Sensor Misc 1 Device by Other route every 14 (fourteen) days. Change sensor every 15 days.   gabapentin 300 MG capsule Commonly known as: NEURONTIN Take by mouth.   hydrocortisone  1 % lotion Apply 1 Application topically 2 (two) times daily as needed for itching (scalp dermatitis.).   insulin  lispro 100 UNIT/ML injection Commonly known as: HUMALOG  Inject 0.14 mLs (14 Units total) into the skin 3 (three) times daily with meals.   Insulin  Pen Needle 32G X 4 MM Misc Use 1 pen in the morning, at noon, in the evening, and at  bedtime.   ketoconazole  2 % shampoo Commonly known as: Nizoral  Apply 1 Application topically 2 (two) times a week.   Lantus  SoloStar 100 UNIT/ML Solostar Pen Generic drug: insulin  glargine Inject 20 Units into the skin daily.   meloxicam 15 MG tablet Commonly known as: MOBIC Take 1 tablet by mouth daily.   metFORMIN  500 MG 24 hr tablet Commonly known as: GLUCOPHAGE -XR Take 3 tablets (1,500 mg total) by mouth daily in the afternoon.   metoprolol  tartrate 25 MG tablet Commonly known as: LOPRESSOR  Take 1 tablet (25 mg total) by mouth 2 (two) times daily.   multivitamin tablet Take 1 tablet by mouth daily.   pantoprazole  40 MG tablet Commonly known as: PROTONIX  Take 1 tablet (40 mg total) by mouth daily before breakfast.         OBJECTIVE:   Vital Signs: BP 136/84 (BP Location: Left Arm, Patient Position: Sitting, Cuff Size: Normal)   Pulse 88   Ht 5' 7.13 (1.705 m)   Wt 166 lb 3.2 oz (75.4 kg)   SpO2 98%   BMI 25.93 kg/m    Wt Readings from Last 3 Encounters:  11/18/23 166 lb 3.2 oz (75.4 kg)  11/07/23 167 lb 3.2 oz (75.8 kg)  06/03/23 152 lb (68.9 kg)     Exam: General: Pt appears well and is in NAD  Heart: RRR  Lungs: CTA  Abdomen: Soft, non tender   LE: No edema   Neuro: MS is good with appropriate affect, pt is alert and Ox3    DM foot exam: 06/03/2023 The skin of  the feet is intact without sores or ulcerations. The pedal pulses are 2+ on right and 2+ on left. The sensation is intact to a screening 5.07, 10 gram monofilament bilaterally           DATA REVIEWED:  Lab Results  Component Value Date   HGBA1C 13.1 (A) 11/18/2023   HGBA1C 11.7 (A) 06/03/2023   HGBA1C 8.3 (H) 02/12/2022    Latest Reference Range & Units 06/03/23 09:31  Sodium 135 - 146 mmol/L 134 (L)  Potassium 3.5 - 5.3 mmol/L 4.0  Chloride 98 - 110 mmol/L 100  CO2 20 - 32 mmol/L 26  Glucose 65 - 99 mg/dL 694 (H)  BUN 7 - 25 mg/dL 15  Creatinine 9.39 - 8.70 mg/dL 9.34  Calcium  8.6 - 10.3 mg/dL 9.2  BUN/Creatinine Ratio 6 - 22 (calc) SEE NOTE:  eGFR > OR = 60 mL/min/1.54m2 116    Latest Reference Range & Units 06/03/23 09:31  TSH 0.40 - 4.50 mIU/L 0.61  T4,Free(Direct) 0.8 - 1.8 ng/dL 1.4    Latest Reference Range & Units 06/03/23 09:31  Microalb, Ur mg/dL 1.1  MICROALB/CREAT RATIO <30 mg/g creat 22  Creatinine, Urine 20 - 320 mg/dL 50   In office BG 731 mg/dL   ASSESSMENT / PLAN / RECOMMENDATIONS:     1) Type 2 Diabetes Mellitus, Poorly controlled, with improving glycemic control, Without complications - Most recent A1c of 11.7 %. Goal A1c < 7.0 %.    -Poorly controlled diabetes due to medication nonadherence and dietary indiscretions -Not a candidate for GLP-1 agonist nor DPP 4 inhibitors due to recurrent pancreatitis - The patient has NOT been on Lantus  or Farxiga  for a while.  My assistant contacted the pharmacy and the patient does have enough refills , per pharmacist pt  has not picked it up refills since April? - He is currently just on Humalog , he did not take Humalog   with breakfast this morning, it appears that he is not consistently taking Humalog  except for maybe once a day - I have encouraged the patient to continue with taking metformin  regardless of incomplete absorption of the tablets is still something better than nothing - Being on insulin  has not  really improved his glucose due to lack of regular intake.  I also suspect there is a cost issue at the pharmacy, hence interrupted medication intake and pick up from the pharmacy - Will start glipizide  as below - Will start pioglitazone  as below, caution against lower extremity edema    MEDICATIONS:   - Continue metformin  500 mg, three tablets daily  - Stop Humalog  - Start glipizide  5 mg, 1 tablet before breakfast and 2 tablets before supper - Start pioglitazone  15 mg daily   EDUCATION / INSTRUCTIONS: BG monitoring instructions: Patient is instructed to check his blood sugars 1 times a day, fasting . Call [facility] if: BG persistently < 70 I reviewed the Rule of 15 for the treatment of hypoglycemia in detail with the patient. Literature supplied.    2) Diabetic complications:  Eye: Does not have known diabetic retinopathy.  Neuro/ Feet: Does not have known diabetic peripheral neuropathy .  Renal: Patient does not have known baseline CKD. He   is not on an ACEI/ARB at present.  3) family history of pheochromocytoma  -Metanephrine and normetanephrine's have been normal  5) Hypogonadism:  - He had low testosterone  x 2 with inappropriately normal LH -I had ordered pituitary MRI, but this was not done.  Patient states he received a letter to the wrong address - During office phone conversation the time the patient opted for clomiphene  rather than testosterone , I did explain to him at that time that this is an off label use and his insurance would not pay for it and should check the price and see if this is doable, patient did not check the price, patient only stated this is not covered by his insurance - Patient continues to show concerns that his testosterone  is low despite having a new baby.  I will send him to urology for second opinion   F/U in 4 months    I spent 40 minutes preparing to see the patient by review of recent labs, imaging and procedures,  obtaining and reviewing separately obtained history, communicating with the patient, ordering medications, tests or procedures, and documenting clinical information in the EHR including the differential Dx, treatment, and any further evaluation and other management    Signed electronically by: Stefano Redgie Butts, MD  [facility]  [facility] [address] [place], [place] [address] Phone: [phone] FAX: [phone]   CC: [place], Betty G, MD [address] [place] [address] Phone: [phone]  Fax: [phone]  Return to Endocrinology clinic as below: Future Appointments  Date Time Provider Department Center  02/13/2024  8:30 AM [place], Betty G, MD [id number] [address]

## 2023-11-18 NOTE — Patient Instructions (Addendum)
 Take metformin  500 mg 3 tablets daily Start Glipizide  5 mg, 1 tablet before Breakfast and 2 tablets before Supper  Pioglitazone  15 mg, 1 tablet every morning      HOW TO TREAT LOW BLOOD SUGARS (Blood sugar LESS THAN 70 MG/DL) Please follow the RULE OF 15 for the treatment of hypoglycemia treatment (when your (blood sugars are less than 70 mg/dL)   STEP 1: Take 15 grams of carbohydrates when your blood sugar is low, which includes:  3-4 GLUCOSE TABS  OR 3-4 OZ OF JUICE OR REGULAR SODA OR ONE TUBE OF GLUCOSE GEL    STEP 2: RECHECK blood sugar in 15 MINUTES STEP 3: If your blood sugar is still low at the 15 minute recheck --> then, go back to STEP 1 and treat AGAIN with another 15 grams of carbohydrates.

## 2023-11-30 IMAGING — DX DG ABDOMEN 1V
1 series · 1 of 1 positions shown · non-contrast
Comparison: None Available.

CLINICAL DATA: Right flank pain for 3 months

EXAM:
ABDOMEN - 1 VIEW

[abdomen supine ap]
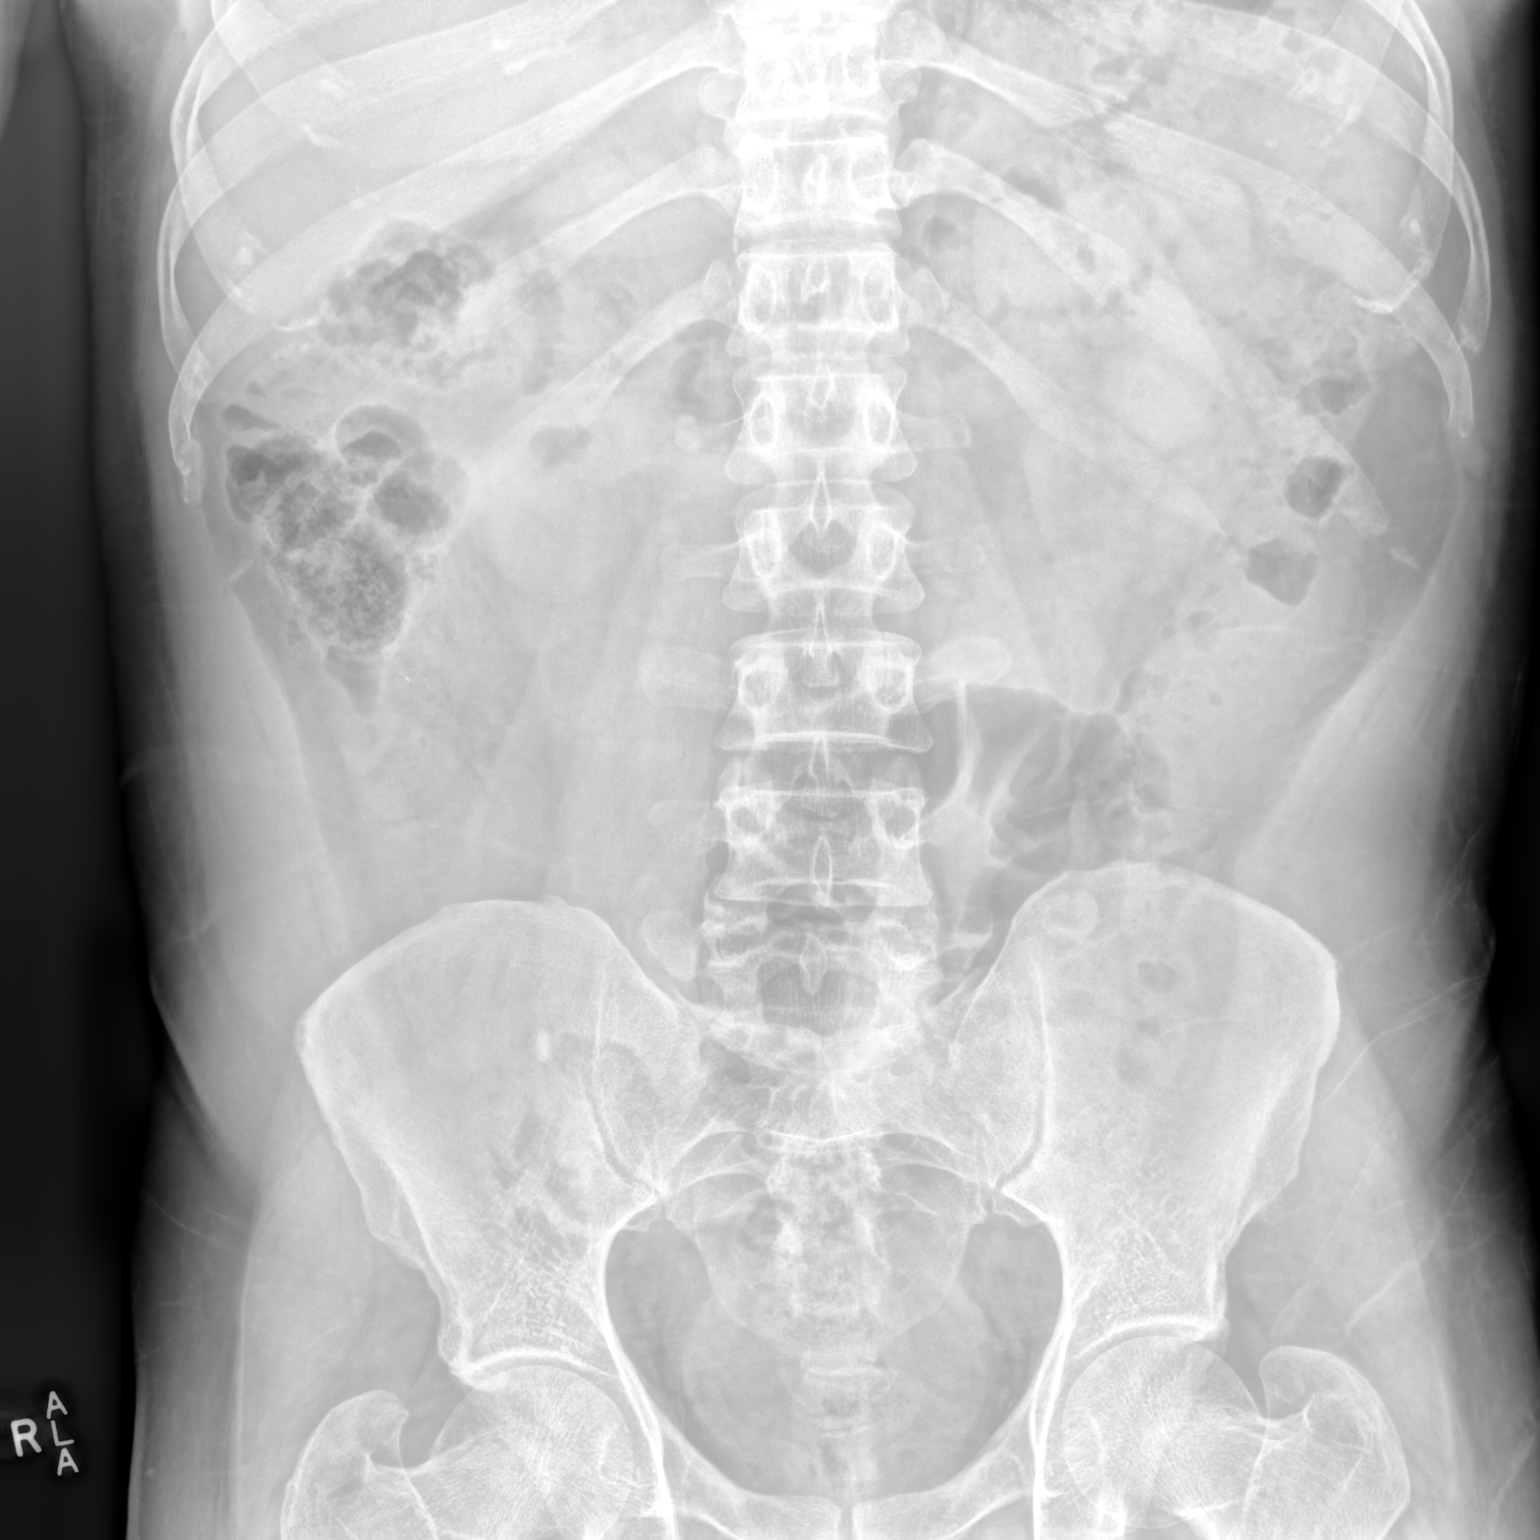

[1 of 1 positions shown; findings below may reference images not displayed]

FINDINGS: Supine frontal view of the abdomen and pelvis excludes the
hemidiaphragms by collimation. 9 mm linear calcific density
projecting over the right iliac crest is more lateral than the
course of the right ureter, and likely reflects material within the
fecal stream. No definite radiopaque calculi are identified on this
exam. No bowel obstruction or ileus. No abdominal masses. Bony
structures are unremarkable.
IMPRESSION: 1. No evidence of urinary tract calculi.

## 2024-02-10 ENCOUNTER — Ambulatory Visit: Admitting: Family Medicine

## 2024-02-13 ENCOUNTER — Ambulatory Visit: Admitting: Family Medicine

## 2024-02-14 ENCOUNTER — Encounter: Payer: Self-pay | Admitting: Internal Medicine

## 2024-03-21 ENCOUNTER — Ambulatory Visit: Admitting: Internal Medicine

## 2024-05-21 ENCOUNTER — Ambulatory Visit: Admitting: Internal Medicine
